# Patient Record
Sex: Female | Born: 1935
Health system: Southern US, Community
[De-identification: ages and names within clinical notes are randomized; demographics above are authoritative.]

## PROBLEM LIST (undated history)

## (undated) DIAGNOSIS — K269 Duodenal ulcer, unspecified as acute or chronic, without hemorrhage or perforation: Secondary | ICD-10-CM

## (undated) DIAGNOSIS — C44319 Basal cell carcinoma of skin of other parts of face: Secondary | ICD-10-CM

## (undated) DIAGNOSIS — K644 Residual hemorrhoidal skin tags: Secondary | ICD-10-CM

## (undated) DIAGNOSIS — R42 Dizziness and giddiness: Secondary | ICD-10-CM

## (undated) DIAGNOSIS — M3320 Polymyositis, organ involvement unspecified: Secondary | ICD-10-CM

## (undated) DIAGNOSIS — E785 Hyperlipidemia, unspecified: Secondary | ICD-10-CM

## (undated) DIAGNOSIS — B029 Zoster without complications: Secondary | ICD-10-CM

## (undated) DIAGNOSIS — S0590XA Unspecified injury of unspecified eye and orbit, initial encounter: Secondary | ICD-10-CM

## (undated) DIAGNOSIS — K648 Other hemorrhoids: Secondary | ICD-10-CM

## (undated) DIAGNOSIS — K219 Gastro-esophageal reflux disease without esophagitis: Secondary | ICD-10-CM

## (undated) DIAGNOSIS — M1990 Unspecified osteoarthritis, unspecified site: Secondary | ICD-10-CM

## (undated) HISTORY — DX: Other hemorrhoids: K64.8

## (undated) HISTORY — PX: TONSILLECTOMY: SUR1361

## (undated) HISTORY — DX: Hyperlipidemia, unspecified: E78.5

## (undated) HISTORY — DX: Basal cell carcinoma of skin of other parts of face: C44.319

## (undated) HISTORY — DX: Polymyositis, organ involvement unspecified: M33.20

## (undated) HISTORY — DX: Duodenal ulcer, unspecified as acute or chronic, without hemorrhage or perforation: K26.9

## (undated) HISTORY — DX: Zoster without complications: B02.9

## (undated) HISTORY — PX: COLONOSCOPY: SHX174

## (undated) HISTORY — DX: Gastro-esophageal reflux disease without esophagitis: K21.9

## (undated) HISTORY — DX: Residual hemorrhoidal skin tags: K64.4

## (undated) HISTORY — DX: Unspecified injury of unspecified eye and orbit, initial encounter: S05.90XA

## (undated) HISTORY — DX: Unspecified osteoarthritis, unspecified site: M19.90

## (undated) HISTORY — PX: ESOPHAGOGASTRODUODENOSCOPY: SHX1529

## (undated) HISTORY — DX: Dizziness and giddiness: R42

---

## 1998-07-22 ENCOUNTER — Other Ambulatory Visit: Admission: RE | Admit: 1998-07-22 | Discharge: 1998-07-22 | Payer: Self-pay | Admitting: Internal Medicine

## 1998-08-03 ENCOUNTER — Ambulatory Visit (HOSPITAL_COMMUNITY): Admission: RE | Admit: 1998-08-03 | Discharge: 1998-08-03 | Payer: Self-pay | Admitting: *Deleted

## 1998-11-09 ENCOUNTER — Encounter: Payer: Self-pay | Admitting: Rheumatology

## 1998-11-09 ENCOUNTER — Ambulatory Visit (HOSPITAL_COMMUNITY): Admission: RE | Admit: 1998-11-09 | Discharge: 1998-11-09 | Payer: Self-pay | Admitting: Rheumatology

## 1998-12-24 HISTORY — PX: CATARACT EXTRACTION, BILATERAL: SHX1313

## 2002-06-23 ENCOUNTER — Encounter: Payer: Self-pay | Admitting: Family Medicine

## 2002-06-23 LAB — CONVERTED CEMR LAB

## 2003-12-25 DIAGNOSIS — K269 Duodenal ulcer, unspecified as acute or chronic, without hemorrhage or perforation: Secondary | ICD-10-CM

## 2003-12-25 DIAGNOSIS — B029 Zoster without complications: Secondary | ICD-10-CM

## 2003-12-25 HISTORY — DX: Duodenal ulcer, unspecified as acute or chronic, without hemorrhage or perforation: K26.9

## 2003-12-25 HISTORY — DX: Zoster without complications: B02.9

## 2005-11-26 ENCOUNTER — Ambulatory Visit: Payer: Self-pay | Admitting: Family Medicine

## 2006-12-27 ENCOUNTER — Encounter: Payer: Self-pay | Admitting: Family Medicine

## 2006-12-30 ENCOUNTER — Ambulatory Visit: Payer: Self-pay | Admitting: Family Medicine

## 2006-12-30 DIAGNOSIS — D485 Neoplasm of uncertain behavior of skin: Secondary | ICD-10-CM

## 2006-12-30 DIAGNOSIS — E7800 Pure hypercholesterolemia, unspecified: Secondary | ICD-10-CM

## 2006-12-30 DIAGNOSIS — R1013 Epigastric pain: Secondary | ICD-10-CM | POA: Insufficient documentation

## 2006-12-31 ENCOUNTER — Encounter: Payer: Self-pay | Admitting: Family Medicine

## 2006-12-31 ENCOUNTER — Telehealth (INDEPENDENT_AMBULATORY_CARE_PROVIDER_SITE_OTHER): Payer: Self-pay | Admitting: *Deleted

## 2007-01-01 ENCOUNTER — Encounter: Payer: Self-pay | Admitting: Family Medicine

## 2007-01-01 LAB — CONVERTED CEMR LAB
ALT: 14 units/L (ref 0–35)
Albumin: 4.5 g/dL (ref 3.5–5.2)
CO2: 28 meq/L (ref 19–32)
Calcium: 9.2 mg/dL (ref 8.4–10.5)
Chloride: 106 meq/L (ref 96–112)
Cholesterol: 263 mg/dL — ABNORMAL HIGH (ref 0–200)
Creatinine, Ser: 0.71 mg/dL (ref 0.40–1.20)
Potassium: 4.4 meq/L (ref 3.5–5.3)
Total Protein: 6.8 g/dL (ref 6.0–8.3)

## 2007-01-07 ENCOUNTER — Ambulatory Visit: Payer: Self-pay | Admitting: Family Medicine

## 2007-01-07 DIAGNOSIS — L570 Actinic keratosis: Secondary | ICD-10-CM | POA: Insufficient documentation

## 2007-01-10 ENCOUNTER — Ambulatory Visit: Payer: Self-pay | Admitting: Family Medicine

## 2007-01-17 ENCOUNTER — Encounter: Payer: Self-pay | Admitting: Family Medicine

## 2007-01-17 ENCOUNTER — Telehealth (INDEPENDENT_AMBULATORY_CARE_PROVIDER_SITE_OTHER): Payer: Self-pay | Admitting: *Deleted

## 2007-01-21 ENCOUNTER — Encounter: Payer: Self-pay | Admitting: Family Medicine

## 2007-01-21 ENCOUNTER — Telehealth (INDEPENDENT_AMBULATORY_CARE_PROVIDER_SITE_OTHER): Payer: Self-pay | Admitting: *Deleted

## 2007-01-21 ENCOUNTER — Ambulatory Visit: Payer: Self-pay | Admitting: Internal Medicine

## 2007-01-23 ENCOUNTER — Encounter: Payer: Self-pay | Admitting: Internal Medicine

## 2007-01-31 ENCOUNTER — Ambulatory Visit: Payer: Self-pay | Admitting: Internal Medicine

## 2007-03-10 ENCOUNTER — Encounter: Payer: Self-pay | Admitting: Family Medicine

## 2007-03-11 ENCOUNTER — Encounter: Payer: Self-pay | Admitting: Family Medicine

## 2007-03-11 LAB — CONVERTED CEMR LAB
ALT: 22 units/L (ref 0–35)
Albumin: 4.7 g/dL (ref 3.5–5.2)
Cholesterol: 178 mg/dL (ref 0–200)
HDL: 48 mg/dL (ref >39)
LDL Cholesterol: 79 mg/dL (ref 0–99)
Total CHOL/HDL Ratio: 3.7
Total Protein: 7.2 g/dL (ref 6.0–8.3)
Triglycerides: 255 mg/dL — ABNORMAL HIGH (ref <150)
VLDL: 51 mg/dL — ABNORMAL HIGH (ref 0–40)

## 2007-03-12 ENCOUNTER — Encounter: Payer: Self-pay | Admitting: Family Medicine

## 2007-03-17 ENCOUNTER — Telehealth: Payer: Self-pay | Admitting: Family Medicine

## 2007-03-17 ENCOUNTER — Encounter: Payer: Self-pay | Admitting: Family Medicine

## 2007-08-18 ENCOUNTER — Telehealth: Payer: Self-pay | Admitting: Family Medicine

## 2007-08-20 ENCOUNTER — Encounter: Payer: Self-pay | Admitting: Family Medicine

## 2008-01-30 ENCOUNTER — Encounter: Admission: RE | Admit: 2008-01-30 | Discharge: 2008-01-30 | Payer: Self-pay | Admitting: Family Medicine

## 2008-01-30 ENCOUNTER — Ambulatory Visit: Payer: Self-pay | Admitting: Family Medicine

## 2008-01-30 DIAGNOSIS — R0789 Other chest pain: Secondary | ICD-10-CM

## 2008-03-30 ENCOUNTER — Telehealth: Payer: Self-pay | Admitting: Family Medicine

## 2008-03-30 ENCOUNTER — Encounter: Payer: Self-pay | Admitting: Family Medicine

## 2008-03-31 LAB — CONVERTED CEMR LAB
AST: 16 units/L (ref 0–37)
Albumin: 5.2 g/dL (ref 3.5–5.2)
Alkaline Phosphatase: 75 units/L (ref 39–117)
HDL: 55 mg/dL (ref >39)
LDL Cholesterol: 85 mg/dL (ref 0–99)
Potassium: 4.6 meq/L (ref 3.5–5.3)
Sodium: 142 meq/L (ref 135–145)
TSH: 2.114 microintl units/mL (ref 0.350–5.50)
Total Bilirubin: 0.5 mg/dL (ref 0.3–1.2)
Total Protein: 7.4 g/dL (ref 6.0–8.3)
Triglycerides: 108 mg/dL (ref <150)
VLDL: 22 mg/dL (ref 0–40)

## 2008-09-10 ENCOUNTER — Encounter: Payer: Self-pay | Admitting: Family Medicine

## 2008-09-12 LAB — CONVERTED CEMR LAB
ALT: 17 units/L (ref 0–35)
AST: 17 units/L (ref 0–37)
Alkaline Phosphatase: 69 units/L (ref 39–117)
BUN: 9 mg/dL (ref 6–23)
Calcium: 8.8 mg/dL (ref 8.4–10.5)
Chloride: 102 meq/L (ref 96–112)
Creatinine, Ser: 0.71 mg/dL (ref 0.40–1.20)
HDL: 48 mg/dL (ref >39)
LDL Cholesterol: 87 mg/dL (ref 0–99)
Potassium: 4 meq/L (ref 3.5–5.3)
Total CHOL/HDL Ratio: 3.2

## 2009-02-07 ENCOUNTER — Ambulatory Visit: Payer: Self-pay | Admitting: Family Medicine

## 2009-02-14 ENCOUNTER — Encounter: Payer: Self-pay | Admitting: Family Medicine

## 2009-02-15 ENCOUNTER — Encounter: Admission: RE | Admit: 2009-02-15 | Discharge: 2009-02-15 | Payer: Self-pay | Admitting: Family Medicine

## 2009-02-15 LAB — CONVERTED CEMR LAB
ALT: 17 units/L (ref 0–35)
AST: 18 units/L (ref 0–37)
Albumin: 4.8 g/dL (ref 3.5–5.2)
Alkaline Phosphatase: 76 units/L (ref 39–117)
Cholesterol, target level: 200 mg/dL
HDL goal, serum: 40 mg/dL
LDL Cholesterol: 112 mg/dL — ABNORMAL HIGH (ref 0–99)
LDL Goal: 160 mg/dL
Potassium: 4.1 meq/L (ref 3.5–5.3)
Sodium: 141 meq/L (ref 135–145)
TSH: 2.093 microintl units/mL (ref 0.350–4.50)
Total Bilirubin: 0.6 mg/dL (ref 0.3–1.2)
Total Protein: 7 g/dL (ref 6.0–8.3)
VLDL: 30 mg/dL (ref 0–40)

## 2010-03-15 ENCOUNTER — Ambulatory Visit: Payer: Self-pay | Admitting: Family Medicine

## 2010-03-16 LAB — CONVERTED CEMR LAB
ALT: 20 units/L (ref 0–35)
AST: 20 units/L (ref 0–37)
BUN: 11 mg/dL (ref 6–23)
Calcium: 9.3 mg/dL (ref 8.4–10.5)
Chloride: 104 meq/L (ref 96–112)
Creatinine, Ser: 0.78 mg/dL (ref 0.40–1.20)
HDL: 56 mg/dL (ref >39)
Total Bilirubin: 0.8 mg/dL (ref 0.3–1.2)
Total CHOL/HDL Ratio: 3.4
VLDL: 40 mg/dL (ref 0–40)

## 2010-03-20 ENCOUNTER — Encounter: Admission: RE | Admit: 2010-03-20 | Discharge: 2010-03-20 | Payer: Self-pay | Admitting: Family Medicine

## 2010-03-21 ENCOUNTER — Encounter: Payer: Self-pay | Admitting: Family Medicine

## 2010-03-22 ENCOUNTER — Encounter: Payer: Self-pay | Admitting: Family Medicine

## 2011-01-14 ENCOUNTER — Encounter: Payer: Self-pay | Admitting: Family Medicine

## 2011-01-23 NOTE — Letter (Signed)
Summary: Generic Letter  [facility]  [address]   [place], [place] [address]   Phone: [phone]  Fax: [phone]    03/22/2010  BABY GIEGER [address] [place], [place]  [address]  Dear Ms. Duell,  We have received your test results back and have been unable to reach you. Your mammogram came back normal.  Your bone density came back and your bones are still thin but no change or worsening which is great. Continue with the Calcium with vitamin D supplement.         Sincerely,   Nani Gasser, MD

## 2011-01-23 NOTE — Assessment & Plan Note (Signed)
Summary: CPE   Vital Signs:  Patient profile:   77 year old female Height:      62.5 inches Weight:      138 pounds BMI:     24.93 Pulse rate:   65 / minute BP sitting:   108 / 66  (left arm) Cuff size:   regular  Vitals Entered By: Kathlene November (March 15, 2010 8:58 AM) CC: CPE   Primary Care Provider:  Emannuel Vise,C  CC:  CPE.  History of Present Illness: Occ gets a heavy feeling in her chest with traveling.  But has had this most of her life. Feels it is stressed induced.  Once calms down CP is relieved. Her weight is up a little.  Had eye exam last year at [facility]. Does need new lenses.     Current Medications (verified): 1)  Oscal 500/200 D-3 500-200 Mg-Unit Tabs (Calcium-Vitamin D) .... Two Times A Day By Mouth 2)  Multivitamins  Tabs (Multiple Vitamin) .... Take 1 Tablet By Mouth Once A Day 3)  Omega-3 350 Mg  Caps (Omega-3 Fatty Acids) 4)  Simvastatin 40 Mg Tabs (Simvastatin) .... Take 1/2  Tablet By Mouth Once A Day At Bedtime  Allergies (verified): 1)  Iodine  Comments:  Nurse/Medical Assistant: The patient's medications and allergies were reviewed with the patient and were updated in the Medication and Allergy Lists. Kathlene November (March 15, 2010 8:58 AM)  Past History:  Past Surgical History: Last updated: 01/30/2008 None  Family History: Last updated: 03/15/2010 Father-alcoholism High chol-Mother, brothers, HTN-mother Mother died of stroke at 20  Social History: Last updated: 10/01/2006 [employer], Ph.D.  Mother lives in a retirement home here in [place].  Travels around the world.  Single and never had a sexual partner. Quit smoking 1962, no drugs, no EtOH, 1 caffeinated drink per day, Regular exercise, including walking and golf.  Past Medical History: Meds:Flax seed oil Shingles 2005  Family History: Father-alcoholism High chol-Mother, brothers, HTN-mother Mother died of stroke at 16  Review of Systems  The patient denies  anorexia, fever, weight loss, weight gain, vision loss, decreased hearing, hoarseness, chest pain, syncope, dyspnea on exertion, peripheral edema, prolonged cough, headaches, hemoptysis, abdominal pain, melena, hematochezia, severe indigestion/heartburn, hematuria, incontinence, genital sores, muscle weakness, suspicious skin lesions, transient blindness, difficulty walking, depression, unusual weight change, abnormal bleeding, enlarged lymph nodes, and breast masses.    Physical Exam  General:  Well-developed,well-nourished,in no acute distress; alert,appropriate and cooperative throughout examination Head:  Normocephalic and atraumatic without obvious abnormalities. No apparent alopecia or balding. Eyes:  No corneal or conjunctival inflammation noted. EOMI. Perrla.  Ears:  External ear exam shows no significant lesions or deformities.  Otoscopic examination reveals clear canals, tympanic membranes are intact bilaterally without bulging, retraction, inflammation or discharge. Hearing is grossly normal bilaterally. Nose:  External nasal examination shows no deformity or inflammation. Nasal mucosa are pink and moist without lesions or exudates. Mouth:  Oral mucosa and oropharynx without lesions or exudates.  Teeth in good repair. Neck:  No deformities, masses, or tenderness noted. Chest Wall:  No deformities, masses, or tenderness noted. Breasts:  No mass, nodules, thickening, tenderness, bulging, retraction, inflamation, nipple discharge or skin changes noted.   Lungs:  Normal respiratory effort, chest expands symmetrically. Lungs are clear to auscultation, no crackles or wheezes. Heart:  Normal rate and regular rhythm. S1 and S2 normal without gallop, murmur, click, rub or other extra sounds. Abdomen:  Bowel sounds positive,abdomen soft and non-tender without masses, organomegaly  or hernias noted. Msk:  No deformity or scoliosis noted of thoracic or lumbar spine.   Pulses:  R and L  carotid,radial,dorsalis pedis and posterior tibial pulses are full and equal bilaterally Extremities:  No clubbing, cyanosis, edema, or deformity noted with normal full range of motion of all joints.   Neurologic:  No cranial nerve deficits noted. Station and gait are normal.DTRs are symmetrical throughout. Sensory, motor and coordinative functions appear intact. Skin:  lost os seborrheaic keratosis on her trunk.  Cervical Nodes:  No lymphadenopathy noted Axillary Nodes:  No palpable lymphadenopathy Psych:  Cognition and judgment appear intact. Alert and cooperative with normal attention span and concentration. No apparent delusions, illusions, hallucinations   Impression & Recommendations:  Problem # 1:  PREVENTIVE HEALTH CARE (ICD-V70.0) Doign well overall. Did encourage her to get back into her exercise and work on weight loss Due for mammo and DEXA nda screening labs.   Vaccines are up to date.  Had shingles in 2005 Orders: T-Comprehensive Metabolic Panel [phone]) T-Lipid Profile [phone])  Complete Medication List: 1)  Oscal 500/200 D-3 500-200 Mg-unit Tabs (Calcium-vitamin d) .... Two times a day by mouth 2)  Multivitamins Tabs (Multiple vitamin) .... Take 1 tablet by mouth once a day 3)  Omega-3 350 Mg Caps (Omega-3 fatty acids) 4)  Simvastatin 40 Mg Tabs (Simvastatin) .... Take 1/2  tablet by mouth once a day at bedtime  Other Orders: T-Dual DXA Bone Density/ Axial ([id number]) T-Mammography Bilateral Screening ([address])  Prevention & Chronic Care Immunizations   Influenza vaccine: given  (10/24/2009)   Influenza vaccine due: 10/24/2010    Tetanus booster: 12/30/2006: Tdap   Tetanus booster due: 12/30/2016    Pneumococcal vaccine: Pneumovax  (12/30/2006)   Pneumococcal vaccine due: None    H. zoster vaccine: Not documented  Colorectal Screening   Hemoccult: Not documented   Hemoccult due: Not Indicated    Colonoscopy: Results: Normal. H.pylori stool was neg.    Results: Hemorrhoids-Internal and external    Location:  [facility] .    (01/31/2007)   Colonoscopy action/deferral: Repeat colonoscopy in 10 years.    (01/31/2007)   Colonoscopy due: 01/2017  Other Screening   Pap smear: Done  (06/23/2002)   Pap smear due: Not Indicated    Mammogram: Normal  (02/16/2009)   Mammogram due: 02/2011    DXA bone density scan: normal  (01/30/2008)   DXA scan due: 01/29/2010    Smoking status: quit  (12/27/2006)  Lipids   Total Cholesterol: 199  (02/14/2009)   LDL: 112  (02/14/2009)   LDL Direct: Not documented   HDL: 57  (02/14/2009)   Triglycerides: 149  (02/14/2009)    SGOT (AST): 18  (02/14/2009)   SGPT (ALT): 17  (02/14/2009) CMP ordered    Alkaline phosphatase: 76  (02/14/2009)   Total bilirubin: 0.6  (02/14/2009)  Self-Management Support :    Lipid self-management support: Not documented    Last Flu Vaccine:  Fluvax 3+ (12/30/2006 2:22:56 PM) Flu Vaccine Result Date:  10/24/2009 Flu Vaccine Result:  given Flu Vaccine Next Due:  1 yr Herpes Zoster Next Due:  Not Indicated

## 2011-01-23 NOTE — Letter (Signed)
Summary: Generic Letter  [facility] Medicine [facility]  [address]   [place], [place] [address]   Phone: [phone]  Fax: [phone]    03/21/2010  ANELIESE BEAUDRY [address] [place], [place]  [address]  Dear Ms. Bohanon,   We have received your lab results and have been unable to reach you by phone. Your labs look great except your triglyceride are high- they are much higher than they were the last time we checked them. You need to increase your Fish Oil tablets to 4 a day and restart exercise and we will recheck in 2 months.        Sincerely,   Nani Gasser, MD

## 2011-03-19 ENCOUNTER — Other Ambulatory Visit: Payer: Self-pay | Admitting: Family Medicine

## 2011-03-19 DIAGNOSIS — Z139 Encounter for screening, unspecified: Secondary | ICD-10-CM

## 2011-03-27 ENCOUNTER — Encounter: Payer: Self-pay | Admitting: Family Medicine

## 2011-04-02 ENCOUNTER — Encounter: Payer: Self-pay | Admitting: Family Medicine

## 2011-04-02 ENCOUNTER — Ambulatory Visit (INDEPENDENT_AMBULATORY_CARE_PROVIDER_SITE_OTHER): Payer: Managed Care, Other (non HMO) | Admitting: Family Medicine

## 2011-04-02 VITALS — BP 104/62 | HR 86 | Ht 62.25 in | Wt 135.0 lb

## 2011-04-02 DIAGNOSIS — E785 Hyperlipidemia, unspecified: Secondary | ICD-10-CM

## 2011-04-02 DIAGNOSIS — Z0000 Encounter for general adult medical examination without abnormal findings: Secondary | ICD-10-CM

## 2011-04-02 DIAGNOSIS — L570 Actinic keratosis: Secondary | ICD-10-CM

## 2011-04-02 NOTE — Progress Notes (Signed)
Subjective:    Patient ID: Carmen Casey, female    DOB: [birth year], 77 y.o.   MRN: [id number]  HPI Here for CPE. She still travels and teaches.  She has her mammogram scheduled for tomorrow. No specific omplaints. Does have 2 skin lesions she would like me to look at today.     Review of Systems  BP 104/62  Pulse 86  Ht 5' 2.25" (1.581 m)  Wt 135 lb (61.236 kg)  BMI 24.49 kg/m2    Allergies  Allergen Reactions  . Iodine     REACTION: itching    Past Medical History  Diagnosis Date  . Shingles 2005  . Basal cell carcinoma of forehead     History reviewed. No pertinent past surgical history.  History   Social History  . Marital Status: Single    Spouse Name: N/A    Number of Children: N/A  . Years of Education: N/A   Occupational History  . Not on file.   Social History Main Topics  . Smoking status: Former Smoker    Quit date: 12/24/1960  . Smokeless tobacco: Not on file  . Alcohol Use: No  . Drug Use: No  . Sexually Active: No   Other Topics Concern  . Not on file   Social History Narrative   [employer], Ph.D.  Mother lives in a retirement home here in [place].  Travels around the world.  Single and never had a sexual partner. Quit smoking 1962, no drugs, no EtOH, 1 caffeinated drink per day, Regular exercise, including walking and golf.    Family History  Problem Relation Age of Onset  . Hyperlipidemia Mother   . Hypertension Mother   . Stroke Mother 39  . Alcohol abuse Father   . Hyperlipidemia Sister     Current outpatient prescriptions:calcium-vitamin D (OSCAL WITH D) 500-200 MG-UNIT per tablet, Take 1 tablet by mouth daily. , Disp: , Rfl: ;  multivitamin (THERAGRAN) per tablet, Take 1 tablet by mouth daily.  , Disp: , Rfl: ;  Omega-3 350 MG CAPS, Take by mouth. 1 tabs po daily, Disp: , Rfl: ;  simvastatin (ZOCOR) 40 MG tablet, Take by mouth. Take 1/2 tab po qhs , Disp: , Rfl:      Objective:   Physical Exam  Constitutional: She  is oriented to person, place, and time. She appears well-developed and well-nourished.  HENT:  Head: Normocephalic and atraumatic.  Right Ear: External ear normal.  Left Ear: External ear normal.  Nose: Nose normal.  Mouth/Throat: Oropharynx is clear and moist.  Eyes: Conjunctivae and EOM are normal. Pupils are equal, round, and reactive to light.  Neck: Normal range of motion. Neck supple. No thyromegaly present.  Cardiovascular: Normal rate, regular rhythm, normal heart sounds and intact distal pulses.   Pulmonary/Chest: Effort normal and breath sounds normal.       Normal breast exam.   Abdominal: Soft. Bowel sounds are normal.  Musculoskeletal: Normal range of motion.  Lymphadenopathy:    She has no cervical adenopathy.  Neurological: She is alert and oriented to person, place, and time. She has normal reflexes.  Skin: Skin is warm and dry.       Actinic keratosis on the left ear pinna. Has a suspicious lesion for basal cell on her forehead. I did recommend she see her Derm for bx.   Psychiatric: She has a normal mood and affect.      Deviation of first and second fingers on her  left hand.  No pain.     Assessment & Plan:  CPE- doing well. She is very active and really takes care of herself. Notes she dose half her cholesterol pill. SHe had her labs drawn this AM.Colonscopy and vaccines are up to date. Due for bone diensity this year. Continue calcium supplement.  No need for pap.   Hyperlipidemia - Due to check LFTs and lipid levels. Has been one year.   Has a suspicious lesion for basal cell on her forehead. I did recommend she see her Derm for bx.   AK on her left ear. Cryotherapy performed and pt tolerated well.

## 2011-04-02 NOTE — Patient Instructions (Signed)
Keep up the regular exercise!  Great job!  Continue your calcium supplement.  We will call you with your lab results by the end of the week.   We will call you with your mammogram results as well.

## 2011-04-03 ENCOUNTER — Telehealth: Payer: Self-pay | Admitting: Family Medicine

## 2011-04-03 ENCOUNTER — Ambulatory Visit
Admission: RE | Admit: 2011-04-03 | Discharge: 2011-04-03 | Disposition: A | Discharge status: Home / Self Care | Payer: Medicare Other | Source: Ambulatory Visit | Attending: Family Medicine | Admitting: Family Medicine

## 2011-04-03 DIAGNOSIS — Z139 Encounter for screening, unspecified: Secondary | ICD-10-CM

## 2011-04-03 LAB — COMPLETE METABOLIC PANEL WITH GFR
ALT: 15 U/L (ref 0–35)
AST: 18 U/L (ref 0–37)
Albumin: 4.7 g/dL (ref 3.5–5.2)
Calcium: 9.5 mg/dL (ref 8.4–10.5)
Chloride: 105 mEq/L (ref 96–112)
Potassium: 4.3 mEq/L (ref 3.5–5.3)
Total Protein: 6.8 g/dL (ref 6.0–8.3)

## 2011-04-03 LAB — CBC
MCH: 31 pg (ref 26.0–34.0)
MCV: 94 fL (ref 78.0–100.0)
Platelets: 186 10*3/uL (ref 150–400)
RDW: 13.5 % (ref 11.5–15.5)

## 2011-04-03 LAB — LIPID PANEL
LDL Cholesterol: 86 mg/dL (ref 0–99)
VLDL: 29 mg/dL (ref 0–40)

## 2011-04-03 NOTE — Telephone Encounter (Signed)
Left message on vm with results  

## 2011-04-03 NOTE — Telephone Encounter (Signed)
Call pt: TG adn LDL look much better this year!!!!  Rest of labs look great as well.

## 2011-04-04 ENCOUNTER — Other Ambulatory Visit: Payer: Self-pay | Admitting: Family Medicine

## 2011-04-04 DIAGNOSIS — R928 Other abnormal and inconclusive findings on diagnostic imaging of breast: Secondary | ICD-10-CM

## 2011-04-05 ENCOUNTER — Telehealth: Payer: Self-pay | Admitting: Family Medicine

## 2011-04-05 NOTE — Telephone Encounter (Signed)
Left message to call back  

## 2011-04-05 NOTE — Telephone Encounter (Signed)
Call pt: there was an abnormality on her mammogram. They should contact you about setting up a diagnostic compression exam.

## 2011-04-10 ENCOUNTER — Ambulatory Visit
Admission: RE | Admit: 2011-04-10 | Discharge: 2011-04-10 | Disposition: A | Discharge status: Home / Self Care | Payer: Medicare Other | Source: Ambulatory Visit | Attending: Family Medicine | Admitting: Family Medicine

## 2011-04-10 DIAGNOSIS — R928 Other abnormal and inconclusive findings on diagnostic imaging of breast: Secondary | ICD-10-CM

## 2011-04-10 NOTE — Telephone Encounter (Signed)
Left message on vm with results  

## 2011-05-11 NOTE — Assessment & Plan Note (Signed)
[facility]                         GASTROENTEROLOGY OFFICE NOTE   Carmen Casey, Carmen Casey                        MRN:          [id number]  DATE:01/21/2007                            DOB:          10-02-1933    REFERRING PHYSICIAN:  Nani Gasser, M.D.   REASON FOR CONSULTATION:  Abdominal pain, reflux.   ASSESSMENT:  A 77 year old white woman with a history of a duodenal  ulcer in January 2005.  She has chronic mid chest and lower sternal or  epigastric discomfort that is worse with tight bras or twisting and  pressure.  Does not seem to be associated with eating, it is not  burning, it was not helped by 8 days of Protonix recently.  It is not  like her ulcer pain.  Her distal esophagus looked normal at her previous  endoscopy.  I do not think she has Barrett's esophagus.  She has had  numerous cardiac workups and this pain has been present for decades.  These cardiac workups have been negative.   She did have some transient early satiety in December, that is gone.  There is no unintentional weight loss.   RECOMMENDATIONS AND PLAN:  1. I think she probably has costochondritis symptoms and she should      try Tylenol or anti-inflammatories as needed.  2. Should the early satiety return, then upper GI endoscopy would be      reasonable, but right now it is gone and it was only temporary.  3. Screening colonoscopy is recommended and she agrees to proceed.      Risks, benefits and indications are explained.  4. I am going to check a stool for Helicobacter pylori antigen to see      if she has irradiated her H. pylori.  She did go to a clinic after      her duodenal ulcer was diagnosed and then was treated with      antibiotics, though I do not have those records.  (She travels      overseas and had her endoscopy in [place] and went to a [place]      health clinic in [place] after the duodenal ulcer was diagnosed).   HISTORY:  A 77 year old single  white woman who has had years of  intermittent discomfort in her mid chest and lower sternal area as well  as epigastrium.  It is an ache that occurs at times.  It is worse with  twisting sometimes and she often wears sports bras because regular bras  cause discomfort.  She denies any dysphagia.  She had some transient  early satiety in December when she was seeing  Dr. Linford Arnold.  She tried Protonix for about 8 days but did not feel much  better.  She has had numerous cardiac workups in the past, she tells me  they have all been negative.  When she gets busy or moves fast, she does  say that the ache does occur.  I do not think it effects her sleep.  Her  GI review of systems is otherwise negative.   PAST MEDICAL  HISTORY:  1. Duodenal ulcer diagnosed at endoscopy January 20, 2004, in [place].      I have reviewed this report.  It was a small duodenal ulcer.      Subsequent to that it sounds like she got antibiotics or perhaps H.      pylori.  She had an ultrasound of the abdomen at that time that      showed uterine leiomyomas but otherwise normal.  2. Dyslipidemia.  3. Osteoarthritis.  4. History of polymyositis treated with prednisone with some residual      pain problems.  5. Prior tonsillectomy.   MEDICATIONS:  1. Simvastatin 40 mg at bedtime.  2. Multivitamin daily.  3. Omega-3 fish oil.  4. Calcium supplements.   DRUG ALLERGIES:  IODINE   FAMILY HISTORY:  Mother died at age 58.  She had a history of heart  disease.   SOCIAL HISTORY:  She is single.  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  She has traveled the world for 30 years.  She has a  glass a wine a day or so, no tobacco or drug use.   REVIEW OF SYSTEMS:  Some joint pain, hot flashes at times.  All other  systems are negative.   PHYSICAL EXAMINATION:  Reveals a well-developed, well-nourished elderly  white woman in no acute distress.  Her height is 5 feet 2 inches.  Her  weight is 134 pounds.   Blood pressure 110/60.  EYES:  Anicteric.  ENT:  Normal mouth sand posterior pharynx.  NECK:  Supple without thyromegaly or mass.  CHEST:  Clear.  HEART:  S1, S2 no murmurs, rubs or gallops.  ABDOMEN:  Is soft, nontender without organomegaly or mass.  LYMPHATIC:  No neck or supraclavicular nodes.  CHEST WALL: Nontender.  PSYCH:  She is alert and oriented x3.  EXTREMITIES:  No edema.   RECENT LAB DATA:  Cholesterol 263, HDL 51, LDL 171, triglycerides 205.  CMET entirely normal.  I reviewed office notes sent by Dr. Linford Arnold.   Note she also has some actinic keratoses problems.   I appreciate the opportunity to care for this patient.     Iva Boop, MD,FACG  Electronically Signed    CEG/MedQ  DD: 01/21/2007  DT: 01/21/2007  Job #: [id number]   cc:   Nani Gasser, M.D.

## 2011-05-13 ENCOUNTER — Other Ambulatory Visit: Payer: Self-pay | Admitting: Family Medicine

## 2011-07-13 ENCOUNTER — Other Ambulatory Visit: Payer: Self-pay | Admitting: Family Medicine

## 2011-10-03 ENCOUNTER — Other Ambulatory Visit: Payer: Self-pay | Admitting: Family Medicine

## 2011-12-04 ENCOUNTER — Other Ambulatory Visit: Payer: Self-pay | Admitting: Family Medicine

## 2011-12-31 ENCOUNTER — Other Ambulatory Visit: Payer: Self-pay | Admitting: Family Medicine

## 2012-02-27 ENCOUNTER — Other Ambulatory Visit: Payer: Self-pay | Admitting: Family Medicine

## 2012-02-27 NOTE — Telephone Encounter (Signed)
Must make appointment 

## 2012-03-13 ENCOUNTER — Encounter: Payer: Self-pay | Admitting: *Deleted

## 2012-03-18 ENCOUNTER — Encounter: Payer: Self-pay | Admitting: Family Medicine

## 2012-03-18 ENCOUNTER — Ambulatory Visit (INDEPENDENT_AMBULATORY_CARE_PROVIDER_SITE_OTHER): Payer: Managed Care, Other (non HMO) | Admitting: Family Medicine

## 2012-03-18 VITALS — BP 105/61 | HR 94 | Ht 62.0 in | Wt 134.0 lb

## 2012-03-18 DIAGNOSIS — R140 Abdominal distension (gaseous): Secondary | ICD-10-CM

## 2012-03-18 DIAGNOSIS — Z9181 History of falling: Secondary | ICD-10-CM

## 2012-03-18 DIAGNOSIS — R141 Gas pain: Secondary | ICD-10-CM | POA: Diagnosis not present

## 2012-03-18 DIAGNOSIS — Z0000 Encounter for general adult medical examination without abnormal findings: Secondary | ICD-10-CM

## 2012-03-18 DIAGNOSIS — Z1331 Encounter for screening for depression: Secondary | ICD-10-CM

## 2012-03-18 DIAGNOSIS — I739 Peripheral vascular disease, unspecified: Secondary | ICD-10-CM

## 2012-03-18 LAB — LIPID PANEL
Total CHOL/HDL Ratio: 3 Ratio
VLDL: 20 mg/dL (ref 0–40)

## 2012-03-18 LAB — COMPLETE METABOLIC PANEL WITH GFR
Albumin: 4.4 g/dL (ref 3.5–5.2)
Alkaline Phosphatase: 69 U/L (ref 39–117)
Calcium: 8.9 mg/dL (ref 8.4–10.5)
Chloride: 100 mEq/L (ref 96–112)
GFR, Est Non African American: 87 mL/min
Glucose, Bld: 97 mg/dL (ref 70–99)
Potassium: 4.5 mEq/L (ref 3.5–5.3)
Sodium: 135 mEq/L (ref 135–145)
Total Protein: 6.5 g/dL (ref 6.0–8.3)

## 2012-03-18 LAB — CBC WITH DIFFERENTIAL/PLATELET
Basophils Absolute: 0 10*3/uL (ref 0.0–0.1)
Basophils Relative: 0 % (ref 0–1)
Hemoglobin: 13.1 g/dL (ref 12.0–15.0)
MCHC: 31.8 g/dL (ref 30.0–36.0)
Neutro Abs: 5.3 10*3/uL (ref 1.7–7.7)
Neutrophils Relative %: 63 % (ref 43–77)
RDW: 12.9 % (ref 11.5–15.5)
WBC: 8.4 10*3/uL (ref 4.0–10.5)

## 2012-03-18 LAB — LIPASE: Lipase: 21 U/L (ref 0–75)

## 2012-03-18 MED ORDER — AMBULATORY NON FORMULARY MEDICATION: Status: DC

## 2012-03-18 NOTE — Patient Instructions (Signed)
Start a regular exercise program and make sure you are eating a healthy diet Try to eat 4 servings of dairy a day or take a calcium supplement (500mg twice a day). Your vaccines are up to date.   

## 2012-03-18 NOTE — Progress Notes (Signed)
Subjective:    Carmen Casey is a 78 y.o. female who presents for Medicare Annual/Subsequent preventive examination.  When her fingers get cold she feels she looses circulation.  Once they turn white it takes awhile to get them warm.  Getting a little more arthritis in her hands as well.  No family hx of autoimmune d/o. No prior dx of reynauds.  Doesn't happen to the feet or other parts of hte body.   Felt bloated  On Friday night and felt like was coming down with viral illness.    Preventive Screening-Counseling & Management  Tobacco History  Smoking status  . Former Smoker  . Quit date: 12/24/1960  Smokeless tobacco  . Not on file     Problems Prior to Visit 1. Hands feeling cold  Current Problems (verified) Patient Active Problem List  Diagnoses  . NEOPLASM, SKIN, UNCERTAIN BEHAVIOR  . HYPERCHOLESTEROLEMIA, MILD  . KERATOSIS, ACTINIC    Medications Prior to Visit Current Outpatient Prescriptions on File Prior to Visit  Medication Sig Dispense Refill  . calcium-vitamin D (OSCAL WITH D) 500-200 MG-UNIT per tablet Take 1 tablet by mouth daily.       . multivitamin (THERAGRAN) per tablet Take 1 tablet by mouth daily.        . Omega-3 350 MG CAPS Take by mouth. 1 tabs po daily      . simvastatin (ZOCOR) 40 MG tablet TAKE 1/2 TABLET BY MOUTH ONCE A DAY AT BEDTIME  30 tablet  0    Current Medications (verified) Current Outpatient Prescriptions  Medication Sig Dispense Refill  . calcium-vitamin D (OSCAL WITH D) 500-200 MG-UNIT per tablet Take 1 tablet by mouth daily.       . multivitamin (THERAGRAN) per tablet Take 1 tablet by mouth daily.        . Omega-3 350 MG CAPS Take by mouth. 1 tabs po daily      . simvastatin (ZOCOR) 40 MG tablet TAKE 1/2 TABLET BY MOUTH ONCE A DAY AT BEDTIME  30 tablet  0  . AMBULATORY NON FORMULARY MEDICATION Medication Name: Zostavax IM x 1  1 vial  0     Allergies (verified) Iodine   PAST HISTORY  Family History Family History  Problem  Relation Age of Onset  . Hyperlipidemia Mother   . Hypertension Mother   . Stroke Mother 23  . Alcohol abuse Father   . Hyperlipidemia Sister     Social History History  Substance Use Topics  . Smoking status: Former Smoker    Quit date: 12/24/1960  . Smokeless tobacco: Not on file  . Alcohol Use: No     Are there smokers in your home (other than you)? No  Risk Factors Current exercise habits: water aerobics  Dietary issues discussed: None, she eats well.    Cardiac risk factors: advanced age (older than 38 for men, 11 for women) and sedentary lifestyle.  Depression Screen (Note: if answer to either of the following is "Yes", a more complete depression screening is indicated)   Over the past two weeks, have you felt down, depressed or hopeless? No  Over the past two weeks, have you felt little interest or pleasure in doing things? No  Have you lost interest or pleasure in daily life? No  Do you often feel hopeless? No  Do you cry easily over simple problems? No  Activities of Daily Living In your present state of health, do you have any difficulty performing the following activities?:  Driving? No Managing money?  No Feeding yourself? No Getting from bed to chair? No Climbing a flight of stairs? No Preparing food and eating?: No Bathing or showering? No Getting dressed: No Getting to the toilet? No Using the toilet:No Moving around from place to place: No In the past year have you fallen or had a near fall?:No   Are you sexually active?  No  Do you have more than one partner?  No  Hearing Difficulties: Yes Do you often ask people to speak up or repeat themselves? No Do you experience ringing or noises in your ears? No Do you have difficulty understanding soft or whispered voices? Yes   Do you feel that you have a problem with memory? No  Do you often misplace items? No  Do you feel safe at home?  Yes  Cognitive Testing  Alert? Yes  Normal  Appearance?Yes  Oriented to person? Yes  Place? Yes   Time? Yes  Recall of three objects?  Yes  Can perform simple calculations? Yes  Displays appropriate judgment?Yes  Can read the correct time from a watch face?Yes   Advanced Directives have been discussed with the patient? Yes  List the Names of Other Physician/Practitioners you currently use: 1.    Indicate any recent Medical Services you may have received from other than [facility] providers in the past year (date may be approximate).  Immunization History  Administered Date(s) Administered  . Influenza Whole 10/24/2005, 12/30/2006, 10/24/2009  . Pneumococcal Polysaccharide 12/30/2006  . Td 12/30/2006    Screening Tests Health Maintenance  Topic Date Due  . Zostavax  12/18/1995  . Influenza Vaccine  09/23/2012  . Tetanus/tdap  12/30/2016  . Colonoscopy  01/31/2017  . Pneumococcal Polysaccharide Vaccine Age 34 And Over  Completed    All answers were reviewed with the patient and necessary referrals were made:  Nandana Krolikowski, MD   03/18/2012   History reviewed: allergies, current medications, past family history, past medical history, past social history, past surgical history and problem list  Review of Systems A comprehensive review of systems was negative.    Objective:     Vision by Snellen chart: right ZOX:[id number] declines measurement, left eye:20/35  Body mass index is 24.51 kg/(m^2). BP 105/61  Pulse 94  Ht 5\' 2"  (1.575 m)  Wt 134 lb (60.782 kg)  BMI 24.51 kg/m2  BP 105/61  Pulse 94  Ht 5\' 2"  (1.575 m)  Wt 134 lb (60.782 kg)  BMI 24.51 kg/m2  General Appearance:    Alert, cooperative, no distress, appears stated age  Head:    Normocephalic, without obvious abnormality, atraumatic  Eyes:    PERRL, conjunctiva/corneas clear, EOM's intact, benign, both eyes  Ears:    Normal TM's and external ear canals, both ears  Nose:   Nares normal, septum midline, mucosa normal, no drainage    or sinus tenderness   Throat:   Lips, mucosa, and tongue normal; teeth and gums normal  Neck:   Supple, symmetrical, trachea midline, no adenopathy;    thyroid:  no enlargement/tenderness/nodules; no carotid   bruit or JVD  Back:     Symmetric, no curvature, ROM normal, no CVA tenderness  Lungs:     Clear to auscultation bilaterally, respirations unlabored  Chest Wall:    No tenderness or deformity   Heart:    Regular rate and rhythm, S1 and S2 normal, no murmur, rub   or gallop  Breast Exam:    No tenderness, masses, or nipple  abnormality  Abdomen:     Soft, non-tender, bowel sounds active all four quadrants,    no masses, no organomegaly  Genitalia:    Not performed.   Rectal:    Not performed.   Extremities:   Extremities normal, atraumatic, no cyanosis or edema  Pulses:   2+ and symmetric all extremities  Skin:   Skin color, texture, turgor normal, no rashes or lesions  Lymph nodes:   Cervical, supraclavicular, and axillary nodes normal  Neurologic:   CNII-XII intact, normal strength, sensation and reflexes    throughout       Assessment:     Annual Wellness Exam.       Plan:     During the course of the visit the patient was educated and counseled about appropriate screening and preventive services including:    shingles vaccine  Diet review for nutrition referral? Yes ____  Not Indicated __x__  Possible Reynaud's.  Will check CBC, sed rate, ANA.  Discussed keeping hand warm with gloves and running them under water.   Blaoting - Seems to have been transient but will check liver enzymes and lipase.    Patient Instructions (the written plan) was given to the patient.  Medicare Attestation I have personally reviewed: The patient's medical and social history Their use of alcohol, tobacco or illicit drugs Their current medications and supplements The patient's functional ability including ADLs,fall risks, home safety risks, cognitive, and hearing and visual impairment Diet and physical  activities Evidence for depression or mood disorders  The patient's weight, height, BMI, and visual acuity have been recorded in the chart.  I have made referrals, counseling, and provided education to the patient based on review of the above and I have provided the patient with a written personalized care plan for preventive services.     Bhavin Monjaraz, MD   03/18/2012

## 2012-03-19 LAB — ANA: Anti Nuclear Antibody(ANA): NEGATIVE

## 2012-03-19 LAB — SEDIMENTATION RATE: Sed Rate: 14 mm/hr (ref 0–22)

## 2012-03-25 ENCOUNTER — Encounter: Payer: Self-pay | Admitting: *Deleted

## 2012-04-10 ENCOUNTER — Other Ambulatory Visit: Payer: Self-pay | Admitting: Family Medicine

## 2012-05-01 ENCOUNTER — Other Ambulatory Visit: Payer: Self-pay | Admitting: *Deleted

## 2012-05-01 MED ORDER — AMBULATORY NON FORMULARY MEDICATION: Status: DC

## 2012-10-15 DIAGNOSIS — Z23 Encounter for immunization: Secondary | ICD-10-CM | POA: Diagnosis not present

## 2012-12-20 ENCOUNTER — Other Ambulatory Visit: Payer: Self-pay | Admitting: Family Medicine

## 2012-12-23 ENCOUNTER — Other Ambulatory Visit: Payer: Self-pay | Admitting: *Deleted

## 2013-03-09 ENCOUNTER — Other Ambulatory Visit: Payer: Self-pay | Admitting: *Deleted

## 2013-03-09 MED ORDER — SIMVASTATIN 40 MG PO TABS: ORAL_TABLET | ORAL | Status: DC

## 2013-03-19 ENCOUNTER — Telehealth: Payer: Self-pay | Admitting: *Deleted

## 2013-03-19 ENCOUNTER — Encounter: Payer: Self-pay | Admitting: Family Medicine

## 2013-03-19 ENCOUNTER — Ambulatory Visit (INDEPENDENT_AMBULATORY_CARE_PROVIDER_SITE_OTHER): Payer: Managed Care, Other (non HMO) | Admitting: Family Medicine

## 2013-03-19 VITALS — BP 107/58 | HR 71 | Ht 62.0 in | Wt 136.0 lb

## 2013-03-19 DIAGNOSIS — Z780 Asymptomatic menopausal state: Secondary | ICD-10-CM

## 2013-03-19 DIAGNOSIS — Z0000 Encounter for general adult medical examination without abnormal findings: Secondary | ICD-10-CM | POA: Diagnosis not present

## 2013-03-19 DIAGNOSIS — E785 Hyperlipidemia, unspecified: Secondary | ICD-10-CM | POA: Diagnosis not present

## 2013-03-19 DIAGNOSIS — Z1231 Encounter for screening mammogram for malignant neoplasm of breast: Secondary | ICD-10-CM

## 2013-03-19 NOTE — Patient Instructions (Addendum)
Keep up a regular exercise program and make sure you are eating a healthy diet Try to eat 4 servings of dairy a day, or if you are lactose intolerant take a calcium with vitamin D daily.  Your vaccines are up to date.   

## 2013-03-19 NOTE — Progress Notes (Signed)
Subjective:    Carmen Casey is a 79 y.o. female who presents for Medicare Annual/Subsequent preventive examination.  Preventive Screening-Counseling & Management  Tobacco History  Smoking status  . Former Smoker  . Quit date: 12/24/1960  Smokeless tobacco  . Not on file     Problems Prior to Visit 1. ST x 3 days with some phelgm. No fever or cough or other URI sxs.  Voice was raspy.  Vomited once about  3 days ago. No pain with swallowing. Hoarseness is actually a little bit better today. No GI symptoms. She took some type of over-the-counter supplement for sore throat a friend brought her. 2. Injured her foot when in [place] after a fall. Did have an xray done that was neg for fracture. Injury was 5 weeks ago.  ACE wrap has helped.  Overall her foot is much better but still a little sore when she walks on it for an extended period. No swelling. She has had some aching up into her left leg just lateral to the tibia. 3. Occ has trouble swallowing if eats to fast. Says usually feels it going down over the uper esophagus. She is ok with monitoring it. No hx of esophageal stricture.  She had an EGD years ago.   Current Problems (verified) Patient Active Problem List  Diagnosis  . NEOPLASM, SKIN, UNCERTAIN BEHAVIOR  . HYPERCHOLESTEROLEMIA, MILD  . KERATOSIS, ACTINIC    Medications Prior to Visit Current Outpatient Prescriptions on File Prior to Visit  Medication Sig Dispense Refill  . simvastatin (ZOCOR) 40 MG tablet TAKE 1/2 TABLET BY MOUTH EVERY NIGHT AT BEDTIME  30 tablet  3   No current facility-administered medications on file prior to visit.    Current Medications (verified) Current Outpatient Prescriptions  Medication Sig Dispense Refill  . simvastatin (ZOCOR) 40 MG tablet TAKE 1/2 TABLET BY MOUTH EVERY NIGHT AT BEDTIME  30 tablet  3   No current facility-administered medications for this visit.     Allergies (verified) Iodine   PAST HISTORY  Family  History Family History  Problem Relation Age of Onset  . Hyperlipidemia Mother   . Hypertension Mother   . Stroke Mother 26  . Alcohol abuse Father   . Hyperlipidemia Sister     Social History History  Substance Use Topics  . Smoking status: Former Smoker    Quit date: 12/24/1960  . Smokeless tobacco: Not on file  . Alcohol Use: No     Are there smokers in your home (other than you)? No  Risk Factors Current exercise habits: golf, water aerobics  Dietary issues discussed: non   Cardiac risk factors: advanced age (older than 36 for men, 70 for women) and dyslipidemia.  Depression Screen (Note: if answer to either of the following is "Yes", a more complete depression screening is indicated)   Over the past two weeks, have you felt down, depressed or hopeless? No  Over the past two weeks, have you felt little interest or pleasure in doing things? No  Have you lost interest or pleasure in daily life? No  Do you often feel hopeless? No  Do you cry easily over simple problems? No  Activities of Daily Living In your present state of health, do you have any difficulty performing the following activities?:  Driving? No Managing money?  No Feeding yourself? No Getting from bed to chair? No Climbing a flight of stairs? No Preparing food and eating?: No Bathing or showering? No Getting dressed: No Getting  to the toilet? No Using the toilet:No Moving around from place to place: No In the past year have you fallen or had a near fall?:No   Are you sexually active?  No  Do you have more than one partner?  No  Hearing Difficulties: Yes, has hearing aids since i last saw her.  Do you often ask people to speak up or repeat themselves? Yes Do you experience ringing or noises in your ears? No Do you have difficulty understanding soft or whispered voices? Yes   Do you feel that you have a problem with memory? No  Do you often misplace items? No  Do you feel safe at home?   No  Cognitive Testing  Alert? Yes  Normal Appearance?Yes  Oriented to person? Yes  Place? Yes   Time? Yes  Recall of three objects?  Yes  Can perform simple calculations? Yes  Displays appropriate judgment?Yes  Can read the correct time from a watch face?Yes   Advanced Directives have been discussed with the patient? Yes  List the Names of Other Physician/Practitioners you currently use: 1.  Dr. Lenn Cal  Indicate any recent Medical Services you may have received from other than [facility] providers in the past year (date may be approximate).  Immunization History  Administered Date(s) Administered  . Influenza Whole 10/24/2005, 12/30/2006, 10/24/2009  . Pneumococcal Polysaccharide 12/30/2006  . Td 12/30/2006    Screening Tests Health Maintenance  Topic Date Due  . Zostavax  12/18/1995  . Influenza Vaccine  08/24/2012  . Tetanus/tdap  12/30/2016  . Colonoscopy  01/31/2017  . Pneumococcal Polysaccharide Vaccine Age 71 And Over  Completed    All answers were reviewed with the patient and necessary referrals were made:  METHENEY,CATHERINE, MD   03/19/2013   History reviewed: allergies, current medications, past family history, past medical history, past social history, past surgical history and problem list  Review of Systems A comprehensive review of systems was negative.    Objective:     Vision by Snellen chart: Had eye exam in the last year in [facility] , Dr. Lenn Cal.   Body mass index is 24.87 kg/(m^2). BP 107/58  Pulse 71  Ht 5\' 2"  (1.575 m)  Wt 136 lb (61.689 kg)  BMI 24.87 kg/m2  BP 107/58  Pulse 71  Ht 5\' 2"  (1.575 m)  Wt 136 lb (61.689 kg)  BMI 24.87 kg/m2  General Appearance:    Alert, cooperative, no distress, appears stated age  Head:    Normocephalic, without obvious abnormality, atraumatic  Eyes:    PERRL, conjunctiva/corneas clear, EOM's intact,, both eyes  Ears:    Normal TM's and external ear canals, both ears  Nose:   Nares  normal, septum midline, mucosa normal, no drainage    or sinus tenderness  Throat:   Lips, mucosa, and tongue normal; teeth and gums normal, petechiae on posterior pharynx   Neck:   Supple, symmetrical, trachea midline, no adenopathy;    thyroid:  no enlargement/tenderness/nodules; no carotid   bruit or JVD  Back:     Symmetric, no curvature, ROM normal, no CVA tenderness  Lungs:     Clear to auscultation bilaterally, respirations unlabored  Chest Wall:    No tenderness or deformity   Heart:    Regular rate and rhythm, S1 and S2 normal, no murmur, rub   or gallop  Breast Exam:    No tenderness, masses, or nipple abnormality  Abdomen:     Soft, non-tender, bowel  sounds active all four quadrants,    no masses, no organomegaly  Genitalia:    Not performed.   Rectal:    Not perfromed.   Extremities:   Extremities normal, atraumatic, no cyanosis or edema  Pulses:   2+ and symmetric all extremities  Skin:   Skin color, texture, turgor normal, no rashes or lesions  Lymph nodes:   Cervical, supraclavicular, and axillary nodes normal  Neurologic:   CNII-XII intact, normal strength, sensation and reflexes    throughout       Assessment:     Annual Wellness Exam.       Plan:     During the course of the visit the patient was educated and counseled about appropriate screening and preventive services including:    Screening electrocardiogram  EKG - Rate of 67bpm, NSR, Inverted T wave in V2.  Normal axis.  No acute ST-T wave changes.    Left foot pain-encouraged her to get about 3 more weeks for total of 8 weeks to completely improve since she did not have a fracture on x-ray. Encouraged her to wear good supportive shoes and not to walk on her wood flooring if possible. If at that point still causing significant discomfort when she walks on it I encouraged her to call and we will get an x-ray.  Viral pharyngitis-recommend symptomatic care. Saline gargles. Can use warm tea with honey. Can  also try zinc lozenges. If not better in a week then please give me a call. She did have some small petechiae which is most consistent with a virus.  Due for mammogram and DEXA scan. Will place orders for both  Diet review for nutrition referral? Yes ____  Not Indicated _x_   Patient Instructions (the written plan) was given to the patient.  Medicare Attestation I have personally reviewed: The patient's medical and social history Their use of alcohol, tobacco or illicit drugs Their current medications and supplements The patient's functional ability including ADLs,fall risks, home safety risks, cognitive, and hearing and visual impairment Diet and physical activities Evidence for depression or mood disorders  The patient's weight, height, BMI, and visual acuity have been recorded in the chart.  I have made referrals, counseling, and provided education to the patient based on review of the above and I have provided the patient with a written personalized care plan for preventive services.     METHENEY,CATHERINE, MD   03/19/2013

## 2013-03-20 ENCOUNTER — Ambulatory Visit: Payer: Managed Care, Other (non HMO)

## 2013-03-20 ENCOUNTER — Encounter: Payer: Self-pay | Admitting: *Deleted

## 2013-03-24 ENCOUNTER — Telehealth: Payer: Self-pay | Admitting: Family Medicine

## 2013-03-24 ENCOUNTER — Ambulatory Visit (INDEPENDENT_AMBULATORY_CARE_PROVIDER_SITE_OTHER): Payer: Managed Care, Other (non HMO)

## 2013-03-24 ENCOUNTER — Ambulatory Visit (HOSPITAL_BASED_OUTPATIENT_CLINIC_OR_DEPARTMENT_OTHER)
Admission: RE | Admit: 2013-03-24 | Discharge: 2013-03-24 | Disposition: A | Discharge status: Home / Self Care | Payer: Managed Care, Other (non HMO) | Source: Ambulatory Visit | Attending: Family Medicine | Admitting: Family Medicine

## 2013-03-24 ENCOUNTER — Ambulatory Visit: Payer: Managed Care, Other (non HMO)

## 2013-03-24 DIAGNOSIS — M949 Disorder of cartilage, unspecified: Secondary | ICD-10-CM | POA: Diagnosis not present

## 2013-03-24 DIAGNOSIS — M899 Disorder of bone, unspecified: Secondary | ICD-10-CM

## 2013-03-24 DIAGNOSIS — Z1231 Encounter for screening mammogram for malignant neoplasm of breast: Secondary | ICD-10-CM | POA: Insufficient documentation

## 2013-03-24 DIAGNOSIS — Z780 Asymptomatic menopausal state: Secondary | ICD-10-CM

## 2013-03-24 DIAGNOSIS — Z13820 Encounter for screening for osteoporosis: Secondary | ICD-10-CM | POA: Diagnosis not present

## 2013-03-24 LAB — COMPLETE METABOLIC PANEL WITH GFR
ALT: 20 U/L (ref 0–35)
AST: 16 U/L (ref 0–37)
Alkaline Phosphatase: 75 U/L (ref 39–117)
GFR, Est Non African American: 85 mL/min
Sodium: 139 mEq/L (ref 135–145)
Total Bilirubin: 0.6 mg/dL (ref 0.3–1.2)
Total Protein: 6.8 g/dL (ref 6.0–8.3)

## 2013-03-24 LAB — LIPID PANEL
HDL: 50 mg/dL (ref >39)
LDL Cholesterol: 116 mg/dL — ABNORMAL HIGH (ref 0–99)
Total CHOL/HDL Ratio: 3.8 Ratio

## 2013-03-24 NOTE — Telephone Encounter (Signed)
Call pt: Bone density is stable No sign change from 2 years ago. Still in the osteopenia range.

## 2013-03-25 NOTE — Telephone Encounter (Signed)
Patient notified of results. Barry Dienes, LPN

## 2013-04-07 DIAGNOSIS — H26499 Other secondary cataract, unspecified eye: Secondary | ICD-10-CM | POA: Diagnosis not present

## 2013-04-07 DIAGNOSIS — H0230 Blepharochalasis unspecified eye, unspecified eyelid: Secondary | ICD-10-CM | POA: Diagnosis not present

## 2013-04-07 DIAGNOSIS — H02409 Unspecified ptosis of unspecified eyelid: Secondary | ICD-10-CM | POA: Diagnosis not present

## 2013-04-07 DIAGNOSIS — H179 Unspecified corneal scar and opacity: Secondary | ICD-10-CM | POA: Diagnosis not present

## 2013-09-14 DIAGNOSIS — Z23 Encounter for immunization: Secondary | ICD-10-CM | POA: Diagnosis not present

## 2013-10-13 DIAGNOSIS — H0230 Blepharochalasis unspecified eye, unspecified eyelid: Secondary | ICD-10-CM | POA: Diagnosis not present

## 2013-10-13 DIAGNOSIS — H26499 Other secondary cataract, unspecified eye: Secondary | ICD-10-CM | POA: Diagnosis not present

## 2013-10-13 DIAGNOSIS — H179 Unspecified corneal scar and opacity: Secondary | ICD-10-CM | POA: Diagnosis not present

## 2013-10-20 DIAGNOSIS — H26499 Other secondary cataract, unspecified eye: Secondary | ICD-10-CM | POA: Diagnosis not present

## 2013-11-10 ENCOUNTER — Other Ambulatory Visit: Payer: Self-pay | Admitting: Family Medicine

## 2013-11-24 DIAGNOSIS — H02409 Unspecified ptosis of unspecified eyelid: Secondary | ICD-10-CM | POA: Diagnosis not present

## 2013-11-24 DIAGNOSIS — H0230 Blepharochalasis unspecified eye, unspecified eyelid: Secondary | ICD-10-CM | POA: Diagnosis not present

## 2013-11-24 DIAGNOSIS — H179 Unspecified corneal scar and opacity: Secondary | ICD-10-CM | POA: Diagnosis not present

## 2013-11-24 DIAGNOSIS — H26499 Other secondary cataract, unspecified eye: Secondary | ICD-10-CM | POA: Diagnosis not present

## 2013-12-23 ENCOUNTER — Other Ambulatory Visit: Payer: Self-pay | Admitting: Family Medicine

## 2014-01-26 ENCOUNTER — Other Ambulatory Visit: Payer: Self-pay | Admitting: Family Medicine

## 2014-03-26 ENCOUNTER — Other Ambulatory Visit: Payer: Self-pay | Admitting: Family Medicine

## 2014-04-01 ENCOUNTER — Encounter: Payer: Self-pay | Admitting: Family Medicine

## 2014-04-01 ENCOUNTER — Ambulatory Visit (INDEPENDENT_AMBULATORY_CARE_PROVIDER_SITE_OTHER): Payer: Medicare Other

## 2014-04-01 ENCOUNTER — Ambulatory Visit (INDEPENDENT_AMBULATORY_CARE_PROVIDER_SITE_OTHER): Payer: Medicare Other | Admitting: Family Medicine

## 2014-04-01 VITALS — BP 120/62 | HR 63 | Ht 62.0 in | Wt 140.0 lb

## 2014-04-01 DIAGNOSIS — Z0000 Encounter for general adult medical examination without abnormal findings: Secondary | ICD-10-CM

## 2014-04-01 DIAGNOSIS — Z1231 Encounter for screening mammogram for malignant neoplasm of breast: Secondary | ICD-10-CM

## 2014-04-01 DIAGNOSIS — R0789 Other chest pain: Secondary | ICD-10-CM | POA: Diagnosis not present

## 2014-04-01 DIAGNOSIS — L570 Actinic keratosis: Secondary | ICD-10-CM

## 2014-04-01 DIAGNOSIS — E785 Hyperlipidemia, unspecified: Secondary | ICD-10-CM | POA: Diagnosis not present

## 2014-04-01 DIAGNOSIS — L821 Other seborrheic keratosis: Secondary | ICD-10-CM

## 2014-04-01 LAB — LIPID PANEL
CHOLESTEROL: 164 mg/dL (ref 0–200)
HDL: 55 mg/dL (ref >39)
LDL CALC: 83 mg/dL (ref 0–99)
TRIGLYCERIDES: 132 mg/dL (ref <150)
Total CHOL/HDL Ratio: 3 Ratio
VLDL: 26 mg/dL (ref 0–40)

## 2014-04-01 LAB — COMPLETE METABOLIC PANEL WITH GFR
ALBUMIN: 4.5 g/dL (ref 3.5–5.2)
ALT: 21 U/L (ref 0–35)
AST: 22 U/L (ref 0–37)
Alkaline Phosphatase: 74 U/L (ref 39–117)
BUN: 8 mg/dL (ref 6–23)
CALCIUM: 9.6 mg/dL (ref 8.4–10.5)
CHLORIDE: 105 meq/L (ref 96–112)
CO2: 25 meq/L (ref 19–32)
CREATININE: 0.64 mg/dL (ref 0.50–1.10)
GFR, Est Non African American: 86 mL/min
Glucose, Bld: 80 mg/dL (ref 70–99)
POTASSIUM: 4.5 meq/L (ref 3.5–5.3)
Sodium: 142 mEq/L (ref 135–145)
Total Bilirubin: 0.7 mg/dL (ref 0.2–1.2)
Total Protein: 6.8 g/dL (ref 6.0–8.3)

## 2014-04-01 MED ORDER — SIMVASTATIN 40 MG PO TABS: ORAL_TABLET | ORAL | Status: DC

## 2014-04-01 NOTE — Progress Notes (Deleted)
  Subjective:     Carmen Casey is a 80 y.o. female and is here for a comprehensive physical exam. The patient reports {problems:16946}.  History   Social History  . Marital Status: Single    Spouse Name: N/A    Number of Children: N/A  . Years of Education: N/A   Occupational History  . missionary.     Social History Main Topics  . Smoking status: Former Smoker    Quit date: 12/24/1960  . Smokeless tobacco: Not on file  . Alcohol Use: No  . Drug Use: No  . Sexual Activity: No   Other Topics Concern  . Not on file   Social History Narrative   [employer], Ph.D.  Mother lives in a retirement home here in [place].  Travels around the world.  Single and never had a sexual partner. Quit smoking 1962, no drugs, no EtOH, 1 caffeinated drink per day, Regular exercise, including walking and golf.  Water exercising.           Health Maintenance  Topic Date Due  . Influenza Vaccine  07/24/2014  . Tetanus/tdap  12/30/2016  . Colonoscopy  01/31/2017  . Pneumococcal Polysaccharide Vaccine Age 3 And Over  Completed  . Zostavax  Completed    {Common ambulatory SmartLinks:19316}  Review of Systems {ros; complete:30496}   Objective:    {Exam, [phone]}    Assessment:    Healthy female exam. ***     Plan:     See After Visit Summary for Counseling Recommendations

## 2014-04-01 NOTE — Progress Notes (Addendum)
Subjective:    Carmen Casey is a 80 y.o. female who presents for Medicare Annual/Subsequent preventive examination.  Preventive Screening-Counseling & Management  Tobacco History  Smoking status  . Former Smoker  . Quit date: 12/24/1960  Smokeless tobacco  . Not on file     Problems Prior to Visit 1. Atypical chest pain when lifting boxes and gardening.  Not with walking or other activities.   2. Skin lesion on the eyebrow.   3. Leson on the right breast that thinks has changes. No pain or discharge.   Current Problems (verified) Patient Active Problem List   Diagnosis Date Noted  . KERATOSIS, ACTINIC 01/07/2007  . NEOPLASM, SKIN, UNCERTAIN BEHAVIOR 99/37/1696  . HYPERCHOLESTEROLEMIA, MILD 12/30/2006    Medications Prior to Visit Current Outpatient Prescriptions on File Prior to Visit  Medication Sig Dispense Refill  . simvastatin (ZOCOR) 40 MG tablet TAKE 1/2 TABLET BY MOUTH EVERY NIGHT AT BEDTIME  30 tablet  0   No current facility-administered medications on file prior to visit.    Current Medications (verified) Current Outpatient Prescriptions  Medication Sig Dispense Refill  . simvastatin (ZOCOR) 40 MG tablet TAKE 1/2 TABLET BY MOUTH EVERY NIGHT AT BEDTIME  30 tablet  0   No current facility-administered medications for this visit.     Allergies (verified) Iodine   PAST HISTORY  Family History Family History  Problem Relation Age of Onset  . Hyperlipidemia Mother   . Hypertension Mother   . Stroke Mother 68  . Alcohol abuse Father   . Hyperlipidemia Sister     Social History History  Substance Use Topics  . Smoking status: Former Smoker    Quit date: 12/24/1960  . Smokeless tobacco: Not on file  . Alcohol Use: No     Are there smokers in your home (other than you)? No  Risk Factors Current exercise habits: golf and water aerobic  Dietary issues discussed: none   Cardiac risk factors: dyslipidemia.  Depression Screen (Note: if  answer to either of the following is "Yes", a more complete depression screening is indicated)   Over the past two weeks, have you felt down, depressed or hopeless? No  Over the past two weeks, have you felt little interest or pleasure in doing things? No  Have you lost interest or pleasure in daily life? No  Do you often feel hopeless? No  Do you cry easily over simple problems? No  Activities of Daily Living In your present state of health, do you have any difficulty performing the following activities?:  Driving? No Managing money?  No Feeding yourself? No Getting from bed to chair? No Climbing a flight of stairs? No Preparing food and eating?: No Bathing or showering? No Getting dressed: No Getting to the toilet? No Using the toilet:No Moving around from place to place: No In the past year have you fallen or had a near fall?:No   Are you sexually active?  No  Do you have more than one partner?  No  Hearing Difficulties: Yes, got hearing aids about 2 years ago.    Do you feel that you have a problem with memory? No  Do you often misplace items? No  Do you feel safe at home?  Yes  Cognitive Testing  Alert? Yes  Normal Appearance?Yes  Oriented to person? Yes  Place? Yes   Time? Yes  Recall of three objects?  Yes  Can perform simple calculations? Yes  Displays appropriate judgment?Yes  Can read  the correct time from a watch face?Yes   Advanced Directives have been discussed with the patient? No  List the Names of Other Physician/Practitioners you currently use: 1.  Dr. Ileene Rubens - dentis 2. Dr. Vinnie Level - optometrist  Indicate any recent Medical Services you may have received from other than [facility] providers in the past year (date may be approximate).  Immunization History  Administered Date(s) Administered  . Influenza Split 10/24/2012  . Influenza Whole 10/24/2005, 12/30/2006, 10/24/2009  . Influenza-Unspecified 09/23/2013  . Pneumococcal Polysaccharide-23 12/30/2006   . Td 12/30/2006  . Zoster 10/24/2012    Screening Tests Health Maintenance  Topic Date Due  . Influenza Vaccine  07/24/2014  . Tetanus/tdap  12/30/2016  . Colonoscopy  01/31/2017  . Pneumococcal Polysaccharide Vaccine Age 56 And Over  Completed  . Zostavax  Completed    All answers were reviewed with the patient and necessary referrals were made:  Laurene Melendrez, MD   04/01/2014   History reviewed: allergies, current medications, past family history, past medical history, past social history, past surgical history and problem list  Review of Systems A comprehensive review of systems was negative.    Objective:     Vision by Snellen chart: had eye exam this year. Will call for report   Body mass index is 25.6 kg/(m^2). BP 120/62  Pulse 63  Ht 5\' 2"  (1.575 m)  Wt 140 lb (63.504 kg)  BMI 25.60 kg/m2  BP 120/62  Pulse 63  Ht 5\' 2"  (1.575 m)  Wt 140 lb (63.504 kg)  BMI 25.60 kg/m2  General Appearance:    Alert, cooperative, no distress, appears stated age  Head:    Normocephalic, without obvious abnormality, atraumatic  Eyes:    PERRL, conjunctiva/corneas clear, EOM's intact, fundi    benign, both eyes  Ears:    Normal TM's and external ear canals, both ears  Nose:   Nares normal, septum midline, mucosa normal, no drainage    or sinus tenderness  Throat:   Lips, mucosa, and tongue normal; teeth and gums normal  Neck:   Supple, symmetrical, trachea midline, no adenopathy;    thyroid:  no enlargement/tenderness/nodules; no carotid   bruit or JVD  Back:     Symmetric, no curvature, ROM normal, no CVA tenderness  Lungs:     Clear to auscultation bilaterally, respirations unlabored  Chest Wall:    No tenderness or deformity   Heart:    Regular rate and rhythm, S1 and S2 normal, no murmur, rub   or gallop  Breast Exam:    No tenderness, masses, or nipple abnormality  Abdomen:     Soft, non-tender, bowel sounds active all four quadrants,    no masses, no organomegaly   Genitalia:    Not performed.   Rectal:    Not performed  Extremities:   Extremities normal, atraumatic, no cyanosis or edema  Pulses:   2+ and symmetric all extremities  Skin:   Skin color, texture, turgor normal, no rashes. She does have an actinic keratosis over the left eyebrow ridge and one towards the middle of the forehead and between the eyebrows. She also has a large seborrheic keratosis on the right outer breast that is more darkly pigmented nevus center and more lightly pigmented on the edges. She thinks it may have been changing.   Lymph nodes:   Cervical, supraclavicular, and axillary nodes normal  Neurologic:   CNII-XII intact, normal strength, sensation and reflexes    throughout  Assessment:     Medicare Wellness Exam.      Plan:     During the course of the visit the patient was educated and counseled about appropriate screening and preventive services including:    Screening mammography - had done this AM  Hyperlipidemia-due for repeat lipid and metabolic panel  Actinic keratoses on her forehead. Discussed treatment options. We'll perform cryotherapy today.  Seborrheic her ptosis on the right breast-she feels like it may be changing. Cryotherapy performed. The lesion recurs then recommend shave biopsy for further evaluation.  Atypical chest pain-will get repeat EKG today. It sounds like it's more muscular from lifting things but we'll do an EKG just to make sure there's no changes from last year. EKG shows rate of 58 bpm, low voltage QRS. No acute changes.    Diet review for nutrition referral? Yes ____  Not Indicated _x__   Patient Instructions (the written plan) was given to the patient.  Medicare Attestation I have personally reviewed: The patient's medical and social history Their use of alcohol, tobacco or illicit drugs Their current medications and supplements The patient's functional ability including ADLs,fall risks, home safety risks,  cognitive, and hearing and visual impairment Diet and physical activities Evidence for depression or mood disorders  The patient's weight, height, BMI, and visual acuity have been recorded in the chart.  I have made referrals, counseling, and provided education to the patient based on review of the above and I have provided the patient with a written personalized care plan for preventive services.     Nadie Fiumara, MD   04/01/2014    Cryotherapy Procedure Note  Pre-operative Diagnosis: Actinic keratosis, seborrheic keratosis  Post-operative Diagnosis: forehead with 2 actinic keratosis and one seborrheic keratosis on the right breast   Locations: see above  Indications: none  Anesthesia: not required    Procedure Details  Patient informed of risks (permanent scarring, infection, light or dark discoloration, bleeding, infection, weakness, numbness and recurrence of the lesion) and benefits of the procedure and verbal informed consent obtained.  The areas are treated with liquid nitrogen therapy, frozen until ice ball extended 2 mm beyond lesion, allowed to thaw, and treated again. The patient tolerated procedure well.  The patient was instructed on post-op care, warned that there may be blister formation, redness and pain. Recommend OTC analgesia as needed for pain.  Condition: Stable  Complications: none.  Plan: 1. Instructed to keep the area dry and covered for 24-48h and clean thereafter. 2. Warning signs of infection were reviewed.   3. Recommended that the patient use OTC acetaminophen as needed for pain.  4. Return as needed.

## 2014-04-01 NOTE — Patient Instructions (Signed)
Keep up a regular exercise program and make sure you are eating a healthy diet Try to eat 4 servings of dairy a day, or if you are lactose intolerant take a calcium with vitamin D daily.  Your vaccines are up to date.   Clean wounds with regular soap and water. Do not use alcohol or peroxide. Can apply a small dab Vaseline to moisturize the area.

## 2014-04-02 ENCOUNTER — Other Ambulatory Visit: Payer: Self-pay | Admitting: Family Medicine

## 2014-04-02 NOTE — Progress Notes (Signed)
Quick Note:  All labs are normal. ______ 

## 2014-04-05 ENCOUNTER — Encounter: Payer: Self-pay | Admitting: *Deleted

## 2014-04-14 IMAGING — DX DG DXA BONE DENSITY STUDY HL7
3 series · 3 of 3 positions shown · non-contrast
Comparison: No significant change in bone density of the lumbar
spine compared to 03/20/2010.

CLINICAL DATA: Post menopausal osteoporosis screening.

[view not recorded (1 of 3)]
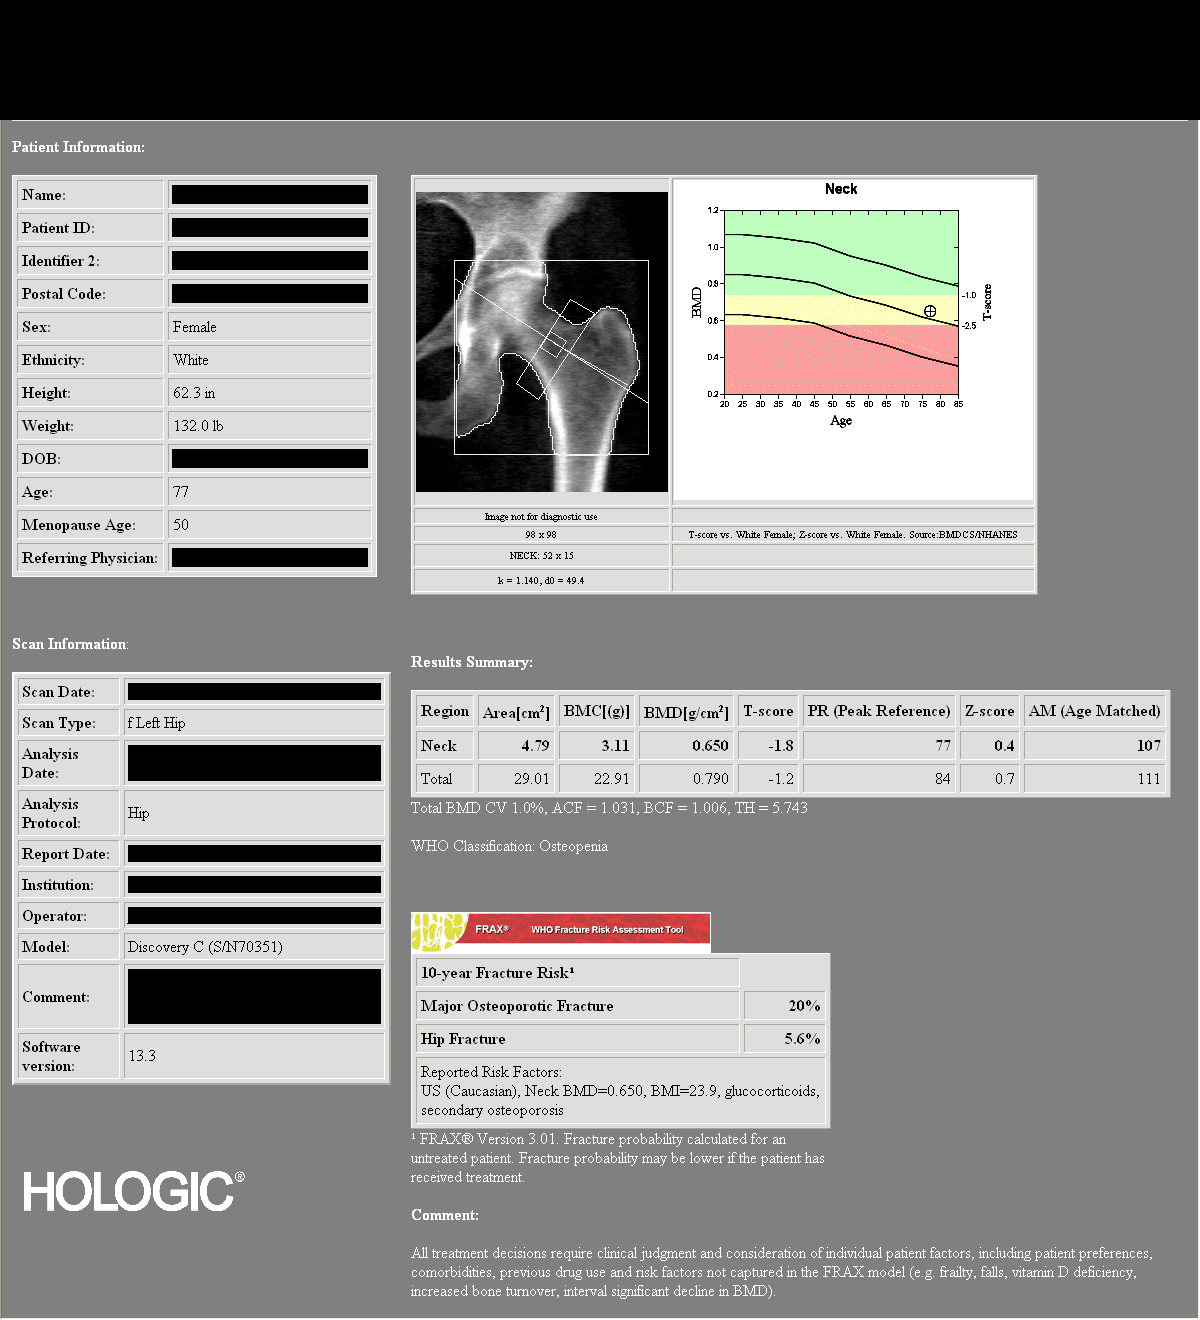

[view not recorded (2 of 3)]
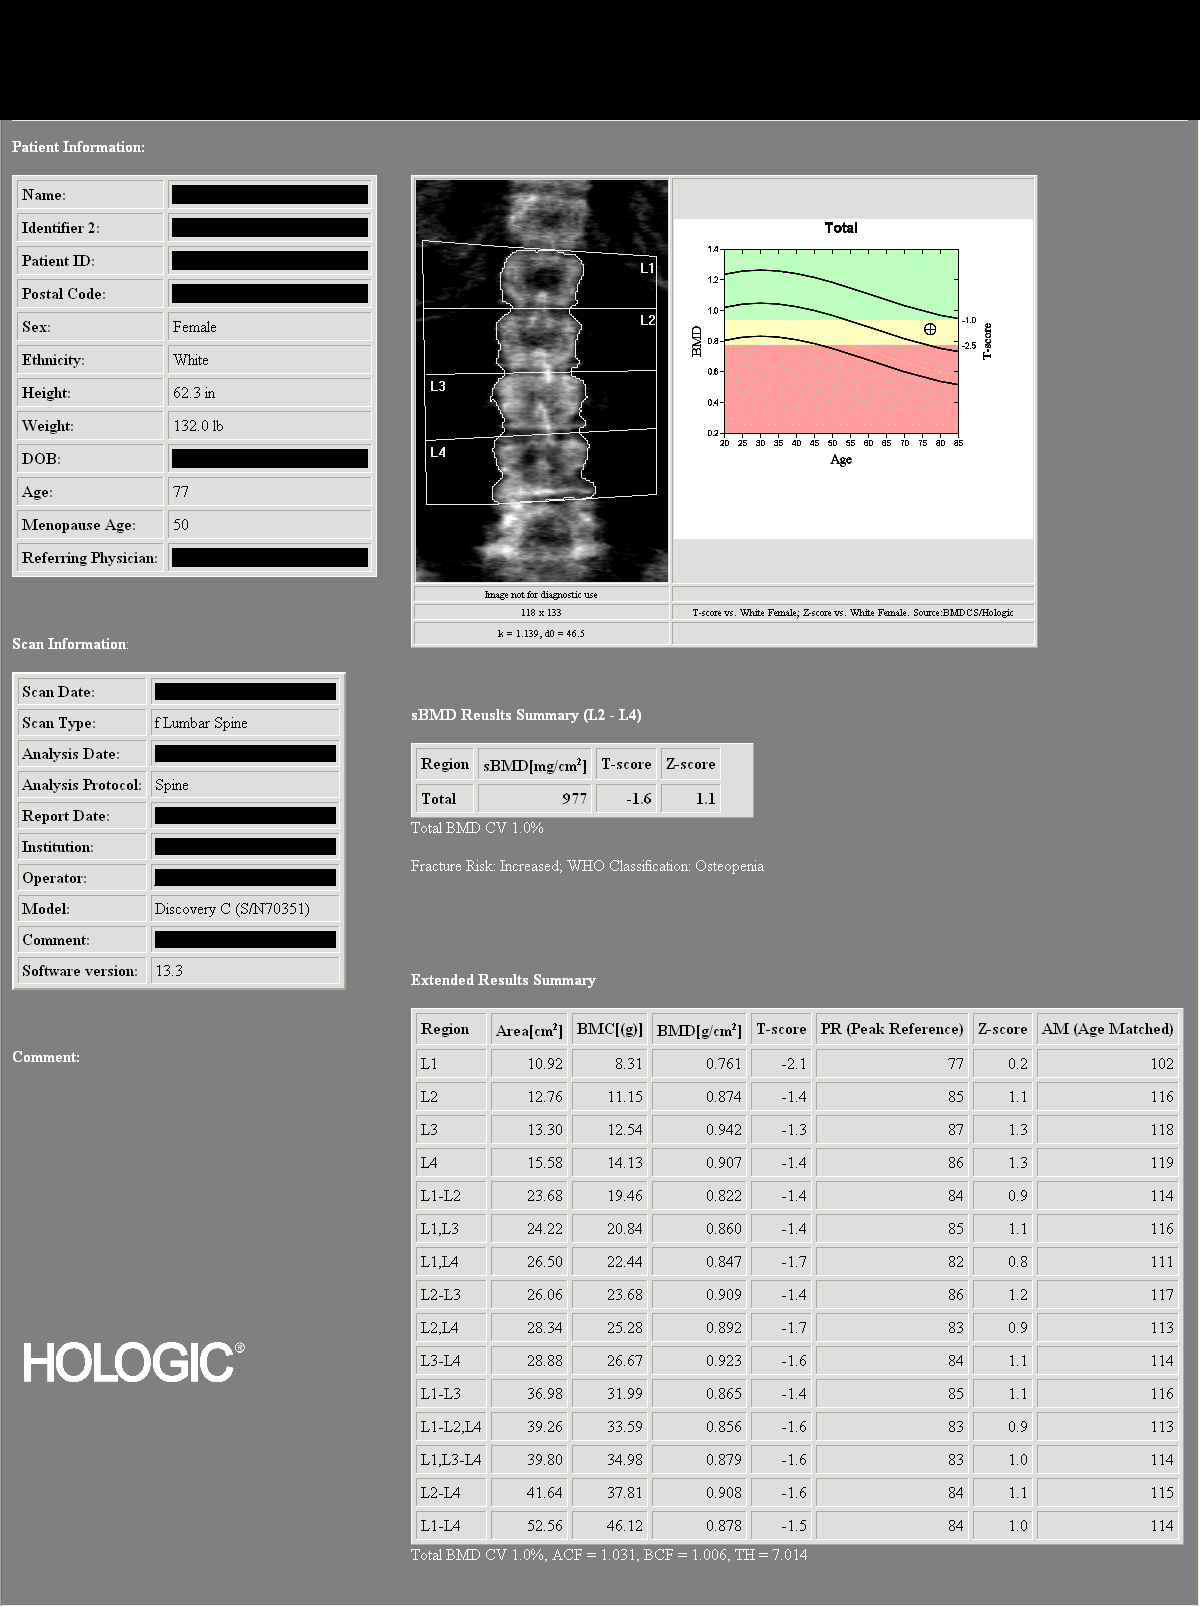

[view not recorded (3 of 3)]
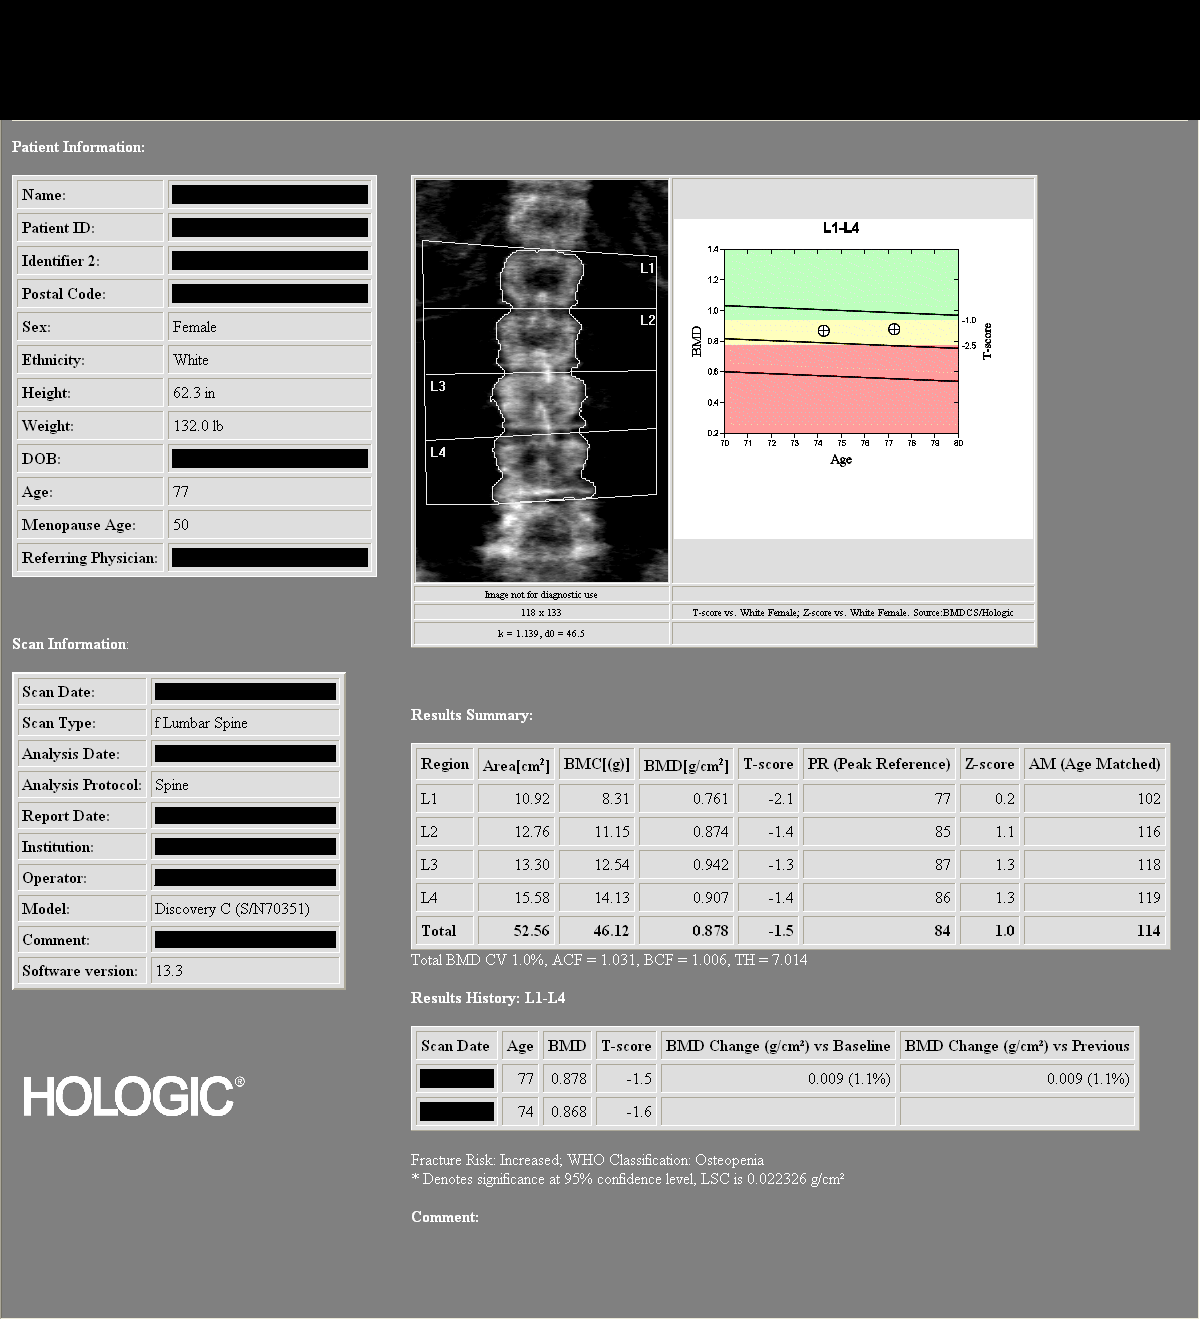

[3 of 3 positions shown; findings below may reference images not displayed]

DUAL X-RAY ABSORPTIOMETRY (DXA) FOR BONE MINERAL DENSITY

AP LUMBAR SPINE L1-L4

Bone Mineral Density (BMD):            0.878 g/cm2
Young Adult T Score:                          -1.5
Z Score:

LEFT FEMUR NECK

Bone Mineral Density (BMD):             0.650 g/cm2
Young Adult T Score:                           -1.8
Z Score:

ASSESSMENT:  Patient's diagnostic category is LOW BONE MASS by WHO
Criteria.

FRACTURE RISK: INCREASED

FRAX: Based on the World Health Organization FRAX model, the 10
year probability of a major osteoporotic fracture is 20%.  The 10
year probability of a hip fracture is 5.6%.
No significant change in bone
density over the total left hip compared to 5255.

RECOMMENDATIONS:

Effective therapies are available in the form of bisphosphonates,
selective estrogen receptor modulators, biologic agents, and
hormone replacement therapy (for women).  All patients should
ensure an adequate intake of dietary calcium (1200mg daily) and
vitamin D (800 Neha Gottesman) unless contraindicated.

All treatment decisions require clinical judgement and
consideration of individual patient factors, including patient
preferences, co-morbidities, previous drug use, risk factors not
captured in the FRAX model (e.g., frailty, falls, vitamin D
deficiency, increased bone turnover, interval significant decline
in bone density) and possible under-or over-estimation of fracture
risk by FRAX.

The National Osteoporosis Foundation recommends that FDA-approved
medical therapies be considered in postmenopausal women and mean
age 50 or older with a:

      1)     Hip or vertebral (clinical or morphometric) fracture.

2)    T-score of -2.5 or lower at the spine or hip.
3)    Ten-year fracture probability by FRAX of 3% or greater for
hip fracture or 20% or greater for major osteoporotic fracture.
FOLLOW-UP:

People with diagnosed cases of osteoporosis or at high risk for
fracture should have regular bone mineral density tests.  For
patients eligible for Medicare, routine testing is allowed once
every 2 years.  The testing frequency can be increased to one year
for patients who have rapidly progressing disease, those who are
receiving or discontinuing medical therapy to restore bone mass, or
have additional risk factors.

World Health Organization (WHO) Criteria:

Normal: T scores from +1.0 to -1.0
Low Bone Mass (Osteopenia): T scores between -1.0 and -2.5
Osteoporosis: T scores -2.5 and below

Comparison to Reference Population:

T score is the key measure used in the diagnosis of osteoporosis
and relative risk determination for fracture.  It provides a value
for bone mass relative to the mean bone mass of a young adult
reference population expressed in terms of standard deviation (SD).

Z score is the age-matched score showing the patient's values
compared to a population matched for age, sex, and race.  This is
also expressed in terms of standard deviation.  The patient may
have values that compare favorably to the age-matched values and
still be at increased risk for fracture.

## 2014-06-24 DIAGNOSIS — H8110 Benign paroxysmal vertigo, unspecified ear: Secondary | ICD-10-CM | POA: Diagnosis not present

## 2014-06-24 DIAGNOSIS — R42 Dizziness and giddiness: Secondary | ICD-10-CM | POA: Diagnosis not present

## 2014-09-21 DIAGNOSIS — Z23 Encounter for immunization: Secondary | ICD-10-CM | POA: Diagnosis not present

## 2015-01-14 ENCOUNTER — Ambulatory Visit: Payer: Medicare Other | Admitting: Family Medicine

## 2015-01-17 ENCOUNTER — Ambulatory Visit (INDEPENDENT_AMBULATORY_CARE_PROVIDER_SITE_OTHER): Payer: Medicare Other | Admitting: Family Medicine

## 2015-01-17 ENCOUNTER — Encounter: Payer: Self-pay | Admitting: Family Medicine

## 2015-01-17 VITALS — BP 106/66 | HR 73 | Ht 62.0 in | Wt 131.0 lb

## 2015-01-17 DIAGNOSIS — H8112 Benign paroxysmal vertigo, left ear: Secondary | ICD-10-CM | POA: Diagnosis not present

## 2015-01-17 NOTE — Progress Notes (Signed)
Subjective:    Patient ID: Carmen Casey, female    DOB: Jun 28, 1933, 81 y.o.   MRN: [id number]  HPI Hx of recurrent Vertigo. Last one was in December. Says usually start when wakes up with it in the AM and then does her exercises.  Does [place] a med as well. She was seen in [facility] for this. Says when happens she cannot stand up as she loses her balance. Has been doing exercises given to her by doc in [place] and that does seem to help some.  Still has mild sxs today but very mild.  Has had dry heaves with it at times.  No ear pain or hearing loss.  She otherwise feels she is doing well. She's been walking for exercise and feels very physically fit. No recent headaches or other changes.  Review of Systems  BP 106/66 mmHg  Pulse 73  Ht 5\' 2"  (1.575 m)  Wt 131 lb (59.421 kg)  BMI 23.95 kg/m2  SpO2 94%    Allergies  Allergen Reactions  . Iodine     REACTION: itching    Past Medical History  Diagnosis Date  . Shingles 2005  . Basal cell carcinoma of forehead   . Eye injury     Right eye as a child.     Past Surgical History  Procedure Laterality Date  . Cataract extraction, bilateral  2000    History   Social History  . Marital Status: Single    Spouse Name: N/A    Number of Children: N/A  . Years of Education: N/A   Occupational History  . missionary.     Social History Main Topics  . Smoking status: Former Smoker    Quit date: 12/24/1960  . Smokeless tobacco: Not on file  . Alcohol Use: No  . Drug Use: No  . Sexual Activity: No   Other Topics Concern  . Not on file   Social History Narrative   [employer], Ph.D.  Mother lives in a retirement home here in [place].  Travels around the world.  Single and never had a sexual partner. Quit smoking 1962, no drugs, no EtOH, 1 caffeinated drink per day, Regular exercise, including walking and golf.  Water exercising.            Family History  Problem Relation Age of Onset  . Hyperlipidemia  Mother   . Hypertension Mother   . Stroke Mother 28  . Alcohol abuse Father   . Hyperlipidemia Sister     Outpatient Encounter Prescriptions as of 01/17/2015  Medication Sig  . simvastatin (ZOCOR) 40 MG tablet TAKE 1/2 TABLET BY MOUTH EVERY NIGHT AT BEDTIME          Objective:   Physical Exam  Constitutional: She is oriented to person, place, and time. She appears well-developed and well-nourished.  HENT:  Head: Normocephalic and atraumatic.  Right Ear: External ear normal.  Left Ear: External ear normal.  Nose: Nose normal.  Mouth/Throat: Oropharynx is clear and moist.  TMs and canals are clear.   Eyes: Conjunctivae and EOM are normal. Pupils are equal, round, and reactive to light.  Neck: Neck supple. No thyromegaly present.  Cardiovascular: Normal rate, regular rhythm and normal heart sounds.   Pulmonary/Chest: Effort normal and breath sounds normal. She has no wheezes.  Musculoskeletal:  Dix-Hallpike did increase some of her symptoms especially on the left compared to the right. But no nystagmus seen on exam.  Lymphadenopathy:  She has no cervical adenopathy.  Neurological: She is alert and oriented to person, place, and time.  Skin: Skin is warm and dry.  Psychiatric: She has a normal mood and affect.          Assessment & Plan:  BPPV - no nystagmus on exam today. Recommend eply manuver.  Ok to continue meclizine.  If not improving in the next 3-4 weeks and let me know. Consider further workup at that time. She's had multiple episodes since April and still feels like she has a little low-grade underlying vertigo some days. Explained sometimes we need physical therapy and sometimes it does require additional workup.  I did ask her to call sooner if she notices that they're increasing in duration or frequency or change in nature.

## 2015-02-04 DIAGNOSIS — Z23 Encounter for immunization: Secondary | ICD-10-CM | POA: Diagnosis not present

## 2015-04-16 ENCOUNTER — Other Ambulatory Visit: Payer: Self-pay | Admitting: Family Medicine

## 2015-05-10 ENCOUNTER — Ambulatory Visit (INDEPENDENT_AMBULATORY_CARE_PROVIDER_SITE_OTHER): Payer: Medicare Other | Admitting: Family Medicine

## 2015-05-10 ENCOUNTER — Encounter: Payer: Self-pay | Admitting: Family Medicine

## 2015-05-10 VITALS — BP 114/63 | HR 54 | Ht 62.0 in | Wt 129.0 lb

## 2015-05-10 DIAGNOSIS — E7800 Pure hypercholesterolemia, unspecified: Secondary | ICD-10-CM

## 2015-05-10 DIAGNOSIS — Z0000 Encounter for general adult medical examination without abnormal findings: Secondary | ICD-10-CM | POA: Diagnosis not present

## 2015-05-10 DIAGNOSIS — Z1231 Encounter for screening mammogram for malignant neoplasm of breast: Secondary | ICD-10-CM

## 2015-05-10 NOTE — Progress Notes (Addendum)
Subjective:   Carmen Casey is a 81 y.o. female who presents for Medicare Annual (Subsequent) preventive examination.  Review of Systems:  Negative        Objective:     Vitals: BP 114/63 mmHg  Pulse 54  Ht 5\' 2"  (1.575 m)  Wt 129 lb (58.514 kg)  BMI 23.59 kg/m2  Tobacco History  Smoking status  . Former Smoker  . Quit date: 12/24/1960  Smokeless tobacco  . Not on file     Counseling given: Not Answered   Past Medical History  Diagnosis Date  . Shingles 2005  . Basal cell carcinoma of forehead   . Eye injury     Right eye as a child.    Past Surgical History  Procedure Laterality Date  . Cataract extraction, bilateral  2000   Family History  Problem Relation Age of Onset  . Hyperlipidemia Mother   . Hypertension Mother   . Stroke Mother 53  . Alcohol abuse Father   . Hyperlipidemia Sister    History  Sexual Activity  . Sexual Activity: No    Outpatient Encounter Prescriptions as of 05/10/2015  Medication Sig  . simvastatin (ZOCOR) 40 MG tablet TAKE 1/2 TABLET BY MOUTH EVERY NIGHT AT BEDTIME  . [DISCONTINUED] PREVNAR 13 SUSP injection    No facility-administered encounter medications on file as of 05/10/2015.    Activities of Daily Living In your present state of health, do you have any difficulty performing the following activities: 05/10/2015  Hearing? N  Vision? N  Difficulty concentrating or making decisions? N  Walking or climbing stairs? N  Dressing or bathing? N  Doing errands, shopping? N     6 CIT  score of 4 out of 28, normal.  Patient Care Team: Hali Marry, MD as PCP - General Juliann Mule, MD as Referring Physician (Ophthalmology)    Assessment:    Medicare Wellness exam  Exercise Activities and Dietary recommendations    Goals    None     Fall Risk Fall Risk  05/10/2015 04/01/2014 03/19/2013  Falls in the past year? No No Yes  Number falls in past yr: - - 1  Injury with Fall? - - No   Depression  Screen PHQ 2/9 Scores 05/10/2015 04/01/2014  PHQ - 2 Score 0 0     Cognitive Testing No flowsheet data found.  Immunization History  Administered Date(s) Administered  . Influenza Split 10/24/2012  . Influenza Whole 10/24/2005, 12/30/2006, 10/24/2009  . Influenza,inj,Quad PF,36+ Mos 09/23/2014  . Influenza-Unspecified 09/23/2013  . Pneumococcal Conjugate-13 02/04/2015  . Pneumococcal Polysaccharide-23 12/30/2006  . Td 12/30/2006  . Zoster 10/24/2012   Screening Tests Health Maintenance  Topic Date Due  . INFLUENZA VACCINE  07/25/2015  . TETANUS/TDAP  12/30/2016  . COLONOSCOPY  01/31/2017  . DEXA SCAN  Completed  . ZOSTAVAX  Completed  . PNA vac Low Risk Adult  Completed      Plan:    Medicare Wellness Exam  During the course of the visit the patient was educated and counseled about the following appropriate screening and preventive services:   Vaccines to include Pneumoccal, Influenza, Hepatitis B, Td, Zostavax, HCV  Colorectal cancer screening - UTD.   Bone density screening - she wants to wait until next year to repeat.   Diabetes screening - glucose has been normal.   Glaucoma screening - due for yearly eye exam.   Nutrition counseling - not recommended.   Patient Instructions (the  written plan) was given to the patient.   METHENEY,CATHERINE, MD  05/10/2015

## 2015-05-10 NOTE — Patient Instructions (Signed)
Keep up a regular exercise program and make sure you are eating a healthy diet Try to eat 4 servings of dairy a day, or if you are lactose intolerant take a calcium with vitamin D daily.  Your vaccines are up to date.   

## 2015-05-11 LAB — COMPLETE METABOLIC PANEL WITH GFR
ALBUMIN: 4.6 g/dL (ref 3.5–5.2)
ALK PHOS: 73 U/L (ref 39–117)
ALT: 17 U/L (ref 0–35)
AST: 19 U/L (ref 0–37)
BUN: 10 mg/dL (ref 6–23)
CALCIUM: 9.1 mg/dL (ref 8.4–10.5)
CHLORIDE: 102 meq/L (ref 96–112)
CO2: 25 mEq/L (ref 19–32)
CREATININE: 0.57 mg/dL (ref 0.50–1.10)
GFR, EST NON AFRICAN AMERICAN: 88 mL/min
GLUCOSE: 86 mg/dL (ref 70–99)
Potassium: 4.2 mEq/L (ref 3.5–5.3)
Sodium: 140 mEq/L (ref 135–145)
TOTAL PROTEIN: 6.8 g/dL (ref 6.0–8.3)
Total Bilirubin: 0.7 mg/dL (ref 0.2–1.2)

## 2015-05-11 LAB — LIPID PANEL
CHOL/HDL RATIO: 3.5 ratio
Cholesterol: 188 mg/dL (ref 0–200)
HDL: 53 mg/dL (ref >=46)
LDL CALC: 110 mg/dL — AB (ref 0–99)
Triglycerides: 123 mg/dL (ref <150)
VLDL: 25 mg/dL (ref 0–40)

## 2015-11-02 DIAGNOSIS — Z23 Encounter for immunization: Secondary | ICD-10-CM | POA: Diagnosis not present

## 2016-01-10 ENCOUNTER — Other Ambulatory Visit: Payer: Self-pay | Admitting: Family Medicine

## 2016-01-10 DIAGNOSIS — E7800 Pure hypercholesterolemia, unspecified: Secondary | ICD-10-CM

## 2016-01-10 NOTE — Progress Notes (Signed)
Pt requested lab recheck, order has been placed.

## 2016-01-11 LAB — LIPID PANEL
CHOL/HDL RATIO: 2.2 ratio (ref <=5.0)
Cholesterol: 168 mg/dL (ref 125–200)
HDL: 75 mg/dL (ref >=46)
LDL CALC: 76 mg/dL (ref <130)
Triglycerides: 86 mg/dL (ref <150)
VLDL: 17 mg/dL (ref <30)

## 2016-04-05 ENCOUNTER — Other Ambulatory Visit: Payer: Self-pay | Admitting: *Deleted

## 2016-04-05 MED ORDER — SIMVASTATIN 40 MG PO TABS: ORAL_TABLET | ORAL | Status: DC

## 2016-04-09 ENCOUNTER — Other Ambulatory Visit: Payer: Self-pay | Admitting: *Deleted

## 2016-04-09 MED ORDER — SIMVASTATIN 40 MG PO TABS: ORAL_TABLET | ORAL | Status: DC

## 2016-04-11 ENCOUNTER — Other Ambulatory Visit: Payer: Self-pay | Admitting: *Deleted

## 2016-04-11 MED ORDER — SIMVASTATIN 40 MG PO TABS: 40.0000 mg | ORAL_TABLET | Freq: Every day | ORAL | Status: DC

## 2016-04-12 ENCOUNTER — Other Ambulatory Visit: Payer: Self-pay | Admitting: *Deleted

## 2016-04-12 MED ORDER — SIMVASTATIN 40 MG PO TABS: ORAL_TABLET | ORAL | Status: DC

## 2016-04-12 NOTE — Progress Notes (Signed)
Logan and spoke w/jennifer(pharmacy tech) to clarify the dose and quantity for Simvastatin 40 mg. Gave permission to change to  1/2 tab QHS #45 with 2 refills. This was also verified by Baker Janus (pharmacist).Audelia Hives [place]

## 2016-05-14 ENCOUNTER — Ambulatory Visit (INDEPENDENT_AMBULATORY_CARE_PROVIDER_SITE_OTHER): Payer: Commercial Managed Care - HMO | Admitting: Family Medicine

## 2016-05-14 ENCOUNTER — Encounter: Payer: Self-pay | Admitting: Family Medicine

## 2016-05-14 ENCOUNTER — Ambulatory Visit (INDEPENDENT_AMBULATORY_CARE_PROVIDER_SITE_OTHER): Payer: Commercial Managed Care - HMO

## 2016-05-14 VITALS — BP 121/54 | HR 61 | Wt 131.0 lb

## 2016-05-14 DIAGNOSIS — R1310 Dysphagia, unspecified: Secondary | ICD-10-CM

## 2016-05-14 DIAGNOSIS — E010 Iodine-deficiency related diffuse (endemic) goiter: Secondary | ICD-10-CM

## 2016-05-14 DIAGNOSIS — E042 Nontoxic multinodular goiter: Secondary | ICD-10-CM | POA: Diagnosis not present

## 2016-05-14 DIAGNOSIS — Z8269 Family history of other diseases of the musculoskeletal system and connective tissue: Secondary | ICD-10-CM | POA: Insufficient documentation

## 2016-05-14 DIAGNOSIS — E041 Nontoxic single thyroid nodule: Secondary | ICD-10-CM

## 2016-05-14 DIAGNOSIS — E049 Nontoxic goiter, unspecified: Secondary | ICD-10-CM | POA: Diagnosis not present

## 2016-05-14 DIAGNOSIS — Z0000 Encounter for general adult medical examination without abnormal findings: Secondary | ICD-10-CM

## 2016-05-14 DIAGNOSIS — H8112 Benign paroxysmal vertigo, left ear: Secondary | ICD-10-CM

## 2016-05-14 DIAGNOSIS — E7800 Pure hypercholesterolemia, unspecified: Secondary | ICD-10-CM

## 2016-05-14 DIAGNOSIS — H8110 Benign paroxysmal vertigo, unspecified ear: Secondary | ICD-10-CM

## 2016-05-14 NOTE — Patient Instructions (Signed)
Mammogram A mammogram is an X-ray of the breasts that is done to check for abnormal changes. This procedure can screen for and detect any changes that may suggest breast cancer. A mammogram can also identify other changes and variations in the breast, such as:  Inflammation of the breast tissue (mastitis).  An infected area that contains a collection of pus (abscess).  A fluid-filled sac (cyst).  Fibrocystic changes. This is when breast tissue becomes denser, which can make the tissue feel rope-like or uneven under the skin.  Tumors that are not cancerous (benign). LET YOUR HEALTH CARE PROVIDER KNOW ABOUT:  Any allergies you have.  If you have breast implants.  If you have had previous breast disease, biopsy, or surgery.  If you are breastfeeding.  Any possibility that you could be pregnant, if this applies.  If you are younger than age 25.  If you have a family history of breast cancer. RISKS AND COMPLICATIONS Generally, this is a safe procedure. However, problems may occur, including:  Exposure to radiation. Radiation levels are very low with this test.  The results being misinterpreted.  The need for further tests.  The inability of the mammogram to detect certain cancers. BEFORE THE PROCEDURE  Schedule your test about 1-2 weeks after your menstrual period. This is usually when your breasts are the least tender.  If you have had a mammogram done at a different facility in the past, get the mammogram X-rays or have them sent to your current exam facility in order to compare them.  Wash your breasts and under your arms the day of the test.  Do not wear deodorants, perfumes, lotions, or powders anywhere on your body on the day of the test.  Remove any jewelry from your neck.  Wear clothes that you can change into and out of easily. PROCEDURE  You will undress from the waist up and put on a gown.  You will stand in front of the X-ray machine.  Each breast will  be placed between two plastic or glass plates. The plates will compress your breast for a few seconds. Try to stay as relaxed as possible during the procedure. This does not cause any harm to your breasts and any discomfort you feel will be very brief.  X-rays will be taken from different angles of each breast. The procedure may vary among health care providers and hospitals. AFTER THE PROCEDURE  The mammogram will be examined by a specialist (radiologist).  You may need to repeat certain parts of the test, depending on the quality of the images. This is commonly done if the radiologist needs a better view of the breast tissue.  Ask when your test results will be ready. Make sure you get your test results.  You may resume your normal activities.   This information is not intended to replace advice given to you by your health care provider. Make sure you discuss any questions you have with your health care provider.   Document Released: 12/07/2000 Document Revised: 08/31/2015 Document Reviewed: 02/18/2015 Elsevier Interactive Patient Education 2016 [employer]  

## 2016-05-14 NOTE — Progress Notes (Signed)
Subjective:    Carmen Casey is a 82 y.o. female who presents for Medicare Annual/Subsequent preventive examination.  Preventive Screening-Counseling & Management  Tobacco History  Smoking status  . Former Smoker  . Quit date: 12/24/1960  Smokeless tobacco  . Not on file     Problems Prior to Visit 1.  Patient complains of difficulty swallowing at the back of her throat. She wonders if it could be related to her polymyositis. She was initially diagnosed in 1999 and feels like she does occasionally have some flares where she'll get extreme fatigue heaviness and aching of the muscles. She says she started to notice some difficulty even with fluids and has actually choked a couple of times. She has complained of feeling more muscle aches recently. 2.  Her vertigo is much better. She says about the only time she notices it now is sometimes when she swims for exercise when she gets out she'll feel a little dizzy. She just does the exercises that I gave her and that usually resolves it fairly quickly. 3. Diet/exercise - she has decreased his sugar intake and increased protein. And her cholesterol looks great. She's been walking 10,000 steps per day and swimming regularly. 4. She is getting new hearing aids today.  Current Problems (verified) Patient Active Problem List   Diagnosis Date Noted  . KERATOSIS, ACTINIC 01/07/2007  . NEOPLASM, SKIN, UNCERTAIN BEHAVIOR [id number]  . HYPERCHOLESTEROLEMIA, MILD 12/30/2006    Medications Prior to Visit Current Outpatient Prescriptions on File Prior to Visit  Medication Sig Dispense Refill  . simvastatin (ZOCOR) 40 MG tablet 1/2 TABLET ONCE A DAY BY MOUTH AT BEDTIME 45 tablet 2   No current facility-administered medications on file prior to visit.    Current Medications (verified) Current Outpatient Prescriptions  Medication Sig Dispense Refill  . simvastatin (ZOCOR) 40 MG tablet 1/2 TABLET ONCE A DAY BY MOUTH AT BEDTIME 45 tablet 2   No  current facility-administered medications for this visit.     Allergies (verified) Iodine   PAST HISTORY  Family History Family History  Problem Relation Age of Onset  . Hyperlipidemia Mother   . Hypertension Mother   . Stroke Mother 41  . Alcohol abuse Father   . Hyperlipidemia Sister     Social History Social History  Substance Use Topics  . Smoking status: Former Smoker    Quit date: 12/24/1960  . Smokeless tobacco: Not on file  . Alcohol Use: No     Are there smokers in your home (other than you)? No  Risk Factors Current exercise habits: swims and walks 10000 steps per day.  Dietary issues discussed: she is eating more proteine and dec her sugar intake.  Her last cholesterol was much better.     Cardiac risk factors: advanced age (older than 78 for men, 10 for women).  Depression Screen (Note: if answer to either of the following is "Yes", a more complete depression screening is indicated)   Over the past two weeks, have you felt down, depressed or hopeless? No  Over the past two weeks, have you felt little interest or pleasure in doing things? No  Have you lost interest or pleasure in daily life? No  Do you often feel hopeless? No  Do you cry easily over simple problems? No  Activities of Daily Living In your present state of health, do you have any difficulty performing the following activities?:  Driving? No Managing money?  No Feeding yourself? No Getting from bed to  chair? No   Climbing a flight of stairs? No Preparing food and eating?: No Bathing or showering? No Getting dressed: No Getting to the toilet? No Using the toilet:No Moving around from place to place: No In the past year have you fallen or had a near fall?:No   Are you sexually active?  No  Do you have more than one partner?  No  Hearing Difficulties: Yes Do you often ask people to speak up or repeat themselves? Yes Do you experience ringing or noises in your ears? Yes Do you have  difficulty understanding soft or whispered voices? Yes   Do you feel that you have a problem with memory? Yes  Do you often misplace items? No  Do you feel safe at home?  Yes  Cognitive Testing  Alert? Yes  Normal Appearance?Yes  Oriented to person? Yes  Place? Yes   Time? Yes  Recall of three objects?  Yes  Can perform simple calculations? Yes  Displays appropriate judgment?Yes  Can read the correct time from a watch face?Yes   Advanced Directives have been discussed with the patient? Yes  List the Names of Other Physician/Practitioners you currently use: 1.  None  Indicate any recent Medical Services you may have received from other than [facility] providers in the past year (date may be approximate).  Immunization History  Administered Date(s) Administered  . Influenza Split 10/24/2012  . Influenza Whole 10/24/2005, 12/30/2006, 10/24/2009  . Influenza,inj,Quad PF,36+ Mos 09/23/2014  . Influenza-Unspecified 09/23/2013  . Pneumococcal Conjugate-13 02/04/2015  . Pneumococcal Polysaccharide-23 12/30/2006  . Td 12/30/2006  . Zoster 10/24/2012    Screening Tests Health Maintenance  Topic Date Due  . INFLUENZA VACCINE  07/24/2016  . TETANUS/TDAP  12/30/2016  . DEXA SCAN  Completed  . ZOSTAVAX  Completed  . PNA vac Low Risk Adult  Completed    All answers were reviewed with the patient and necessary referrals were made:  Carmen Virgen, MD   05/14/2016   History reviewed: allergies, current medications, past family history, past medical history, past social history, past surgical history and problem list  Review of Systems A comprehensive review of systems was negative.    Objective:     Vision by Snellen chart: right eye:20/100, left eye:20/15  Body mass index is 23.95 kg/(m^2). BP 121/54 mmHg  Pulse 61  Wt 131 lb (59.421 kg)  SpO2 99%  BP 121/54 mmHg  Pulse 61  Wt 131 lb (59.421 kg)  SpO2 99% General appearance: alert, cooperative and appears stated  age Head: Normocephalic, without obvious abnormality, atraumatic Eyes: conj clear, EOMI, PEERLA Ears: normal TM's and external ear canals both ears Nose: Nares normal. Septum midline. Mucosa normal. No drainage or sinus tenderness. Throat: lips, mucosa, and tongue normal; teeth and gums normal Neck: no adenopathy, no carotid bruit, no JVD, supple, symmetrical, trachea midline and thyroid enlarged but symmetric Back: symmetric, no curvature. ROM normal. No CVA tenderness. Lungs: clear to auscultation bilaterally Breasts: normal appearance, no masses or tenderness Heart: regular rate and rhythm, S1, S2 normal, no murmur, click, rub or gallop Abdomen: soft, non-tender; bowel sounds normal; no masses,  no organomegaly Extremities: extremities normal, atraumatic, no cyanosis or edema Pulses: 2+ and symmetric Skin: Skin color, texture, turgor normal. No rashes or lesions Lymph nodes: Cervical, supraclavicular, and axillary nodes normal. Neurologic: Alert and oriented X 3, normal strength and tone. Normal symmetric reflexes. Normal coordination and gait     Assessment:     Medicare Wellness Exam  Plan:     During the course of the visit the patient was educated and counseled about appropriate screening and preventive services including:    Dysphagia - refer for EGD . Unfortunately she is sleeping town and will not be back until August. 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 She wants to wait on her referral to gastroneurology until she gets back. Encouraged her to chew her food very well and make sure she stays hydrated and to let us know if it's getting worse sooner.  Myalgias-not sure if related to her prior history of polymyositis or a side effect from the statins. For now she says she is comfortable with continuing her statin but will let me know if it gets worse.  Hearing loss-getting new hearing aids today.  Vertigo-improved but not resolved. She has an effective treatment when she  does get episodes.  Thyromegaly-her thyroid is mildly enlarged on exam today. We'll schedule for ultrasound and check a thyroid level.    Diet review for nutrition referral? Yes ____  Not Indicated _x___   Patient Instructions (the written plan) was given to the patient.  Medicare Attestation I have personally reviewed: The patient's medical and social history Their use of alcohol, tobacco or illicit drugs Their current medications and supplements The patient's functional ability including ADLs,fall risks, home safety risks, cognitive, and hearing and visual impairment Diet and physical activities Evidence for depression or mood disorders  The patient's weight, height, BMI, and visual acuity have been recorded in the chart.  I have made referrals, counseling, and provided education to the patient based on review of the above and I have provided the patient with a written personalized care plan for preventive services.     Everlie Eble, MD   05/14/2016

## 2016-05-15 LAB — TSH: TSH: 0.98 mIU/L

## 2016-05-15 LAB — COMPLETE METABOLIC PANEL WITH GFR
ALT: 13 U/L (ref 6–29)
AST: 17 U/L (ref 10–35)
Albumin: 4.5 g/dL (ref 3.6–5.1)
Alkaline Phosphatase: 66 U/L (ref 33–130)
BILIRUBIN TOTAL: 0.6 mg/dL (ref 0.2–1.2)
BUN: 8 mg/dL (ref 7–25)
CHLORIDE: 102 mmol/L (ref 98–110)
CO2: 25 mmol/L (ref 20–31)
CREATININE: 0.63 mg/dL (ref 0.60–0.88)
Calcium: 9 mg/dL (ref 8.6–10.4)
GFR, EST NON AFRICAN AMERICAN: 85 mL/min (ref >=60)
GLUCOSE: 85 mg/dL (ref 65–99)
Potassium: 4.7 mmol/L (ref 3.5–5.3)
SODIUM: 139 mmol/L (ref 135–146)
TOTAL PROTEIN: 6.3 g/dL (ref 6.1–8.1)

## 2016-05-15 LAB — CBC
HEMATOCRIT: 41.2 % (ref 35.0–45.0)
HEMOGLOBIN: 14.1 g/dL (ref 11.7–15.5)
MCH: 31.3 pg (ref 27.0–33.0)
MCHC: 34.2 g/dL (ref 32.0–36.0)
MCV: 91.6 fL (ref 80.0–100.0)
MPV: 11.3 fL (ref 7.5–12.5)
Platelets: 197 10*3/uL (ref 140–400)
RBC: 4.5 MIL/uL (ref 3.80–5.10)
RDW: 13.1 % (ref 11.0–15.0)
WBC: 5.8 10*3/uL (ref 3.8–10.8)

## 2016-05-29 ENCOUNTER — Other Ambulatory Visit: Payer: Self-pay | Admitting: Family Medicine

## 2016-06-15 ENCOUNTER — Encounter: Payer: Self-pay | Admitting: Internal Medicine

## 2016-08-23 ENCOUNTER — Ambulatory Visit (INDEPENDENT_AMBULATORY_CARE_PROVIDER_SITE_OTHER): Payer: Commercial Managed Care - HMO | Admitting: Internal Medicine

## 2016-08-23 ENCOUNTER — Encounter: Payer: Self-pay | Admitting: Internal Medicine

## 2016-08-23 VITALS — BP 114/68 | HR 67 | Ht 62.25 in | Wt 128.0 lb

## 2016-08-23 DIAGNOSIS — R1310 Dysphagia, unspecified: Secondary | ICD-10-CM

## 2016-08-23 NOTE — Patient Instructions (Signed)
  You have been scheduled for an endoscopy. Please follow written instructions given to you at your visit today. If you use inhalers (even only as needed), please bring them with you on the day of your procedure.   I appreciate the opportunity to care for you. Carl Gessner, MD, FACG 

## 2016-08-23 NOTE — Progress Notes (Signed)
  Referred RN:[id number] Devin Going, [address], [place] [address]   Assessment & Plan:   Encounter Diagnosis  Name Primary?  [name] Dysphagia Yes   Diff Dx is stricture vs motility problems. Cancer not likely w/ hx and lack of weight loss  Plan is EGD and likely esophageal dilation. The risks and benefits as well as alternatives of endoscopic procedure(s) have been discussed and reviewed. All questions answered. The patient agrees to proceed.  May need PPI - discussed rationale to reduce chance of stricture recurrence if that is the problem as almost always due to GERD.     Subjective:    Patient ID: Carmen Casey, female    DOB: 1933/10/02, 82 y.o.   MRN: [id number] Cc: dysphagia HPI   Elderly ww, retired Statistician, with several months or longer of swallowing problems. Choking when she eats.  Began eating slowly - helped but recurrent problems with solid> liquid dysphagia - food impaction and tightness in chest, sobv, scary  No weight loss.  No heartburn.  Had EGD in [place] 2005. Colonoscopy negative 2006 - me  Very active walks > 1 mi daily and does all ADL's Medications, allergies, past medical history, past surgical history, family history and social history are reviewed and updated in the EMR.  Review of Systems As per HPI Uses hearing aids All other ROS negative    Objective:   Physical Exam @BP  114/68   Pulse 67   Ht 5' 2.25" (1.581 m)   Wt 128 lb (58.1 kg)   BMI 23.22 kg/m @  General:  Well-developed, well-nourished and in no acute distress Eyes:  anicteric. ENT:   Mouth and posterior pharynx free of lesions. Teeth in decent shape/intact Neck:   supple w/o thyromegaly or mass.  Lungs: Clear to auscultation bilaterally. Heart:  S1S2, no rubs, murmurs, gallops. Abdomen:  soft, non-tender, no hepatosplenomegaly, hernia, or mass and BS+.  Lymph:  no cervical or supraclavicular adenopathy. Extremities:   no edema,  cyanosis or clubbing Skin   no rash. Neuro:  A&O x 3.  Psych:  appropriate mood and  Affect.   Data Reviewed:  PCP note 04/2016 Wt Readings from Last 3 Encounters:  08/23/16 128 lb (58.1 kg)  05/14/16 131 lb (59.4 kg)  05/10/15 129 lb (58.5 kg)   NL CBC, TSH, CMET 2017    I appreciate the opportunity to care for this patient. RE:[id number], MD

## 2016-08-24 ENCOUNTER — Ambulatory Visit (AMBULATORY_SURGERY_CENTER): Payer: Commercial Managed Care - HMO | Admitting: Internal Medicine

## 2016-08-24 ENCOUNTER — Encounter: Payer: Self-pay | Admitting: Internal Medicine

## 2016-08-24 VITALS — BP 130/80 | HR 85 | Temp 98.2°F | Resp 16 | Ht 62.25 in | Wt 128.0 lb

## 2016-08-24 DIAGNOSIS — K222 Esophageal obstruction: Secondary | ICD-10-CM | POA: Diagnosis not present

## 2016-08-24 DIAGNOSIS — R1310 Dysphagia, unspecified: Secondary | ICD-10-CM | POA: Diagnosis present

## 2016-08-24 MED ORDER — OMEPRAZOLE 20 MG PO CPDR: 20.0000 mg | DELAYED_RELEASE_CAPSULE | Freq: Every day | ORAL | 3 refills | Status: DC

## 2016-08-24 MED ORDER — SODIUM CHLORIDE 0.9 % IV SOLN: 500.0000 mL | INTRAVENOUS | Status: DC

## 2016-08-24 NOTE — Progress Notes (Signed)
Called to room to assist during endoscopic procedure.  Patient ID and intended procedure confirmed with present staff. Received instructions for my participation in the procedure from the performing physician.  

## 2016-08-24 NOTE — Progress Notes (Signed)
To recovery report to [facility]

## 2016-08-24 NOTE — Patient Instructions (Addendum)
There was a benign stricture (narrow area) wher the esophagus and stomach meet. This is almost always from acid reflux.  I dilated it so you can swallow better.  I am prescribing medication to block acid and help prevent recurrence of this problem.  Please call me with an update in early October to tell me how you are doing with your swallowing.  I appreciate the opportunity to care for you. Gatha Mayer, MD, FACG  YOU HAD AN ENDOSCOPIC PROCEDURE TODAY AT [facility]:   Refer to the procedure report that was given to you for any specific questions about what was found during the examination.  If the procedure report does not answer your questions, please call your gastroenterologist to clarify.  If you requested that your care partner not be given the details of your procedure findings, then the procedure report has been included in a sealed envelope for you to review at your convenience later.  YOU SHOULD EXPECT: Some feelings of bloating in the abdomen. Passage of more gas than usual.  Walking can help get rid of the air that was put into your GI tract during the procedure and reduce the bloating.   Please Note:  You might notice some irritation and congestion in your nose or some drainage.  This is from the oxygen used during your procedure.  There is no need for concern and it should clear up in a day or so.  SYMPTOMS TO REPORT IMMEDIATELY:    Following upper endoscopy (EGD)  Vomiting of blood or coffee ground material  New chest pain or pain under the shoulder blades  Painful or persistently difficult swallowing  New shortness of breath  Fever of 100F or higher  Black, tarry-looking stools  For urgent or emergent issues, a gastroenterologist can be reached at any hour by calling [phone].   DIET: Clear liquids until 1:30pm today, and then a soft diet for the rest of today.. You may have a regular diet tomorrow. Drink plenty of fluids but you  should avoid alcoholic beverages for 24 hours.  ACTIVITY:  You should plan to take it easy for the rest of today and you should NOT DRIVE or use heavy machinery until tomorrow (because of the sedation medicines used during the test).    FOLLOW UP: Our staff will call the number listed on your records the next business day following your procedure to check on you and address any questions or concerns that you may have regarding the information given to you following your procedure. If we do not reach you, we will leave a message.  However, if you are feeling well and you are not experiencing any problems, there is no need to return our call.  We will assume that you have returned to your regular daily activities without incident.  If any biopsies were taken you will be contacted by phone or by letter within the next 1-3 weeks.  Please call us at [phone] if you have not heard about the biopsies in 3 weeks.    SIGNATURES/CONFIDENTIALITY: You and/or your care partner have signed paperwork which will be entered into your electronic medical record.  These signatures attest to the fact that that the information above on your After Visit Summary has been reviewed and is understood.  Full responsibility of the confidentiality of this discharge information lies with you and/or your care-partner.  Take omeprazole 20mg  daily forever.  Read all of the handouts given to you  by your recovery room nurse.

## 2016-08-24 NOTE — Op Note (Signed)
[facility] Patient Name: Carmen Casey Procedure Date: 08/24/2016 11:51 AM MRN: [id number] Endoscopist: Gatha Mayer , MD Age: 82 Referring MD:  Date of Birth: 02-24-1933 Gender: Female Account #: [id number] Procedure:                Upper GI endoscopy Indications:              Dysphagia Medicines:                Propofol per Anesthesia, Monitored Anesthesia Care Procedure:                Pre-Anesthesia Assessment:                           - Prior to the procedure, a History and Physical                            was performed, and patient medications and                            allergies were reviewed. The patient's tolerance of                            previous anesthesia was also reviewed. The risks                            and benefits of the procedure and the sedation                            options and risks were discussed with the patient.                            All questions were answered, and informed consent                            was obtained. Prior Anticoagulants: The patient has                            taken no previous anticoagulant or antiplatelet                            agents. ASA Grade Assessment: II - A patient with                            mild systemic disease. After reviewing the risks                            and benefits, the patient was deemed in                            satisfactory condition to undergo the procedure.                           After obtaining informed consent, the endoscope was  passed under direct vision. Throughout the                            procedure, the patient's blood pressure, pulse, and                            oxygen saturations were monitored continuously. The                            Model GIF-HQ190 [id number]) scope was introduced                            through the mouth, and advanced to the second part                            of duodenum. The  upper GI endoscopy was                            accomplished without difficulty. The patient                            tolerated the procedure well. Scope In: Scope Out: Findings:                 One moderate benign-appearing, intrinsic stenosis                            was found 31 cm from the incisors. This measured                            1.3 cm (inner diameter) x less than one cm (in                            length) and was traversed. A TTS dilator was passed                            through the scope. Dilation with a 16-17-18 mm                            balloon dilator was performed to 18 mm. The                            dilation site was examined and showed moderate                            improvement in luminal narrowing. Estimated blood                            loss was minimal.                           A 5 cm hiatal hernia was present.  The exam was otherwise without abnormality.                           The cardia and gastric fundus were normal on                            retroflexion. Complications:            No immediate complications. Estimated Blood Loss:     Estimated blood loss was minimal. Impression:               - Benign-appearing esophageal stenosis. Dilated.                           - 5 cm hiatal hernia.                           - The examination was otherwise normal.                           - No specimens collected. Recommendation:           - Patient has a contact number available for                            emergencies. The signs and symptoms of potential                            delayed complications were discussed with the                            patient. Return to normal activities tomorrow.                            Written discharge instructions were provided to the                            patient.                           - Clear liquids x 1 hour then soft foods rest of                             day. Start prior diet tomorrow.                           - Use Prilosec (omeprazole) 20 mg PO daily for the                            rest of the patient's life.                           - CALL DR. Carlean Purl IN 1 MONTH TO TELL HIM HOW                            SWALLOWING IS - IF STILL HAVING PROBLEMS WOULD  REPEAT A DILATION.                           - Continue present medications. Gatha Mayer, MD 08/24/2016 12:15:55 PM This report has been signed electronically.

## 2016-08-25 ENCOUNTER — Encounter: Payer: Self-pay | Admitting: Internal Medicine

## 2016-08-28 ENCOUNTER — Telehealth: Payer: Self-pay | Admitting: *Deleted

## 2016-08-28 NOTE — Telephone Encounter (Signed)
  Follow up Call-  Call back number 08/24/2016  Post procedure Call Back phone  # [phone]  Permission to leave phone message Yes  Some recent data might be hidden     Patient questions:  Message left to call us if necessary.

## 2016-09-07 IMAGING — US US SOFT TISSUE HEAD/NECK
1 series · 14 of 25 positions shown · non-contrast
Comparison: None.

CLINICAL DATA: Thyromegaly.  Complains of dysphasia.

EXAM:
THYROID ULTRASOUND
TECHNIQUE: Ultrasound examination of the thyroid gland and adjacent soft
tissues was performed.

[Series 1: us soft tissue head/neck · 0.05mm/px · 14 of 45 slices shown]
[im 1/45]
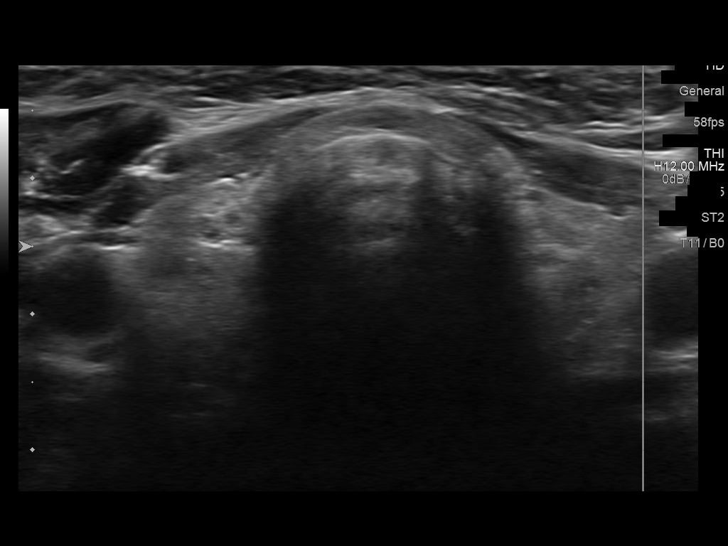
[im 4/45]
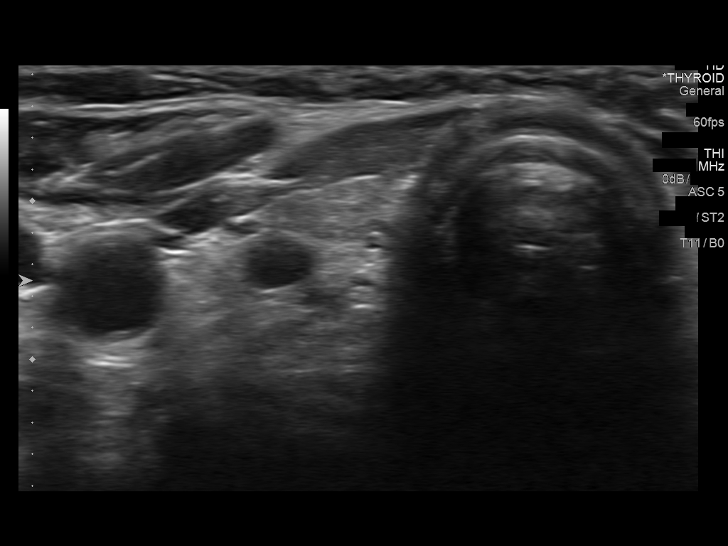
[im 8/45]
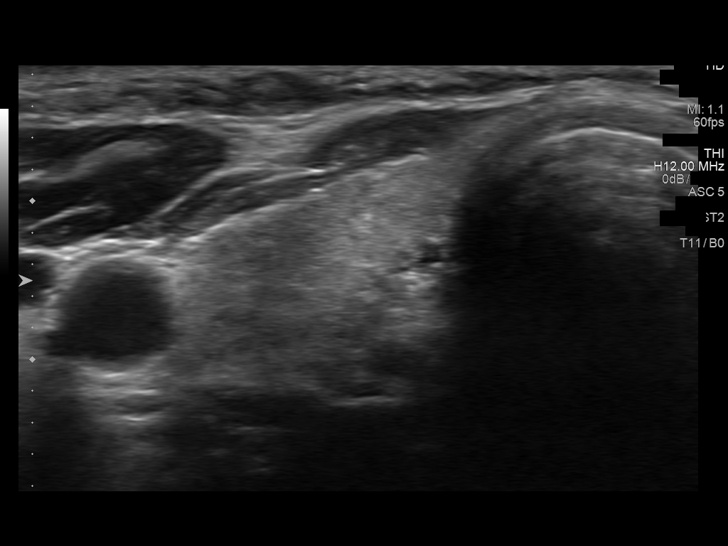
[im 12/45]
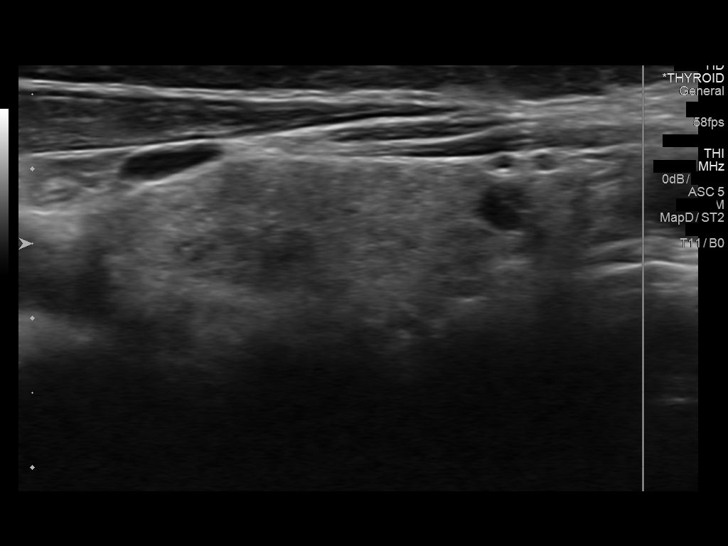
[im 15/45]
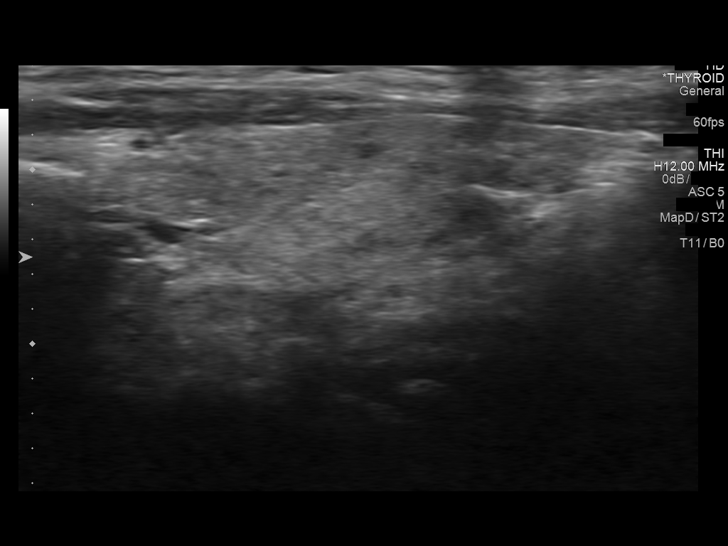
[im 17/45]
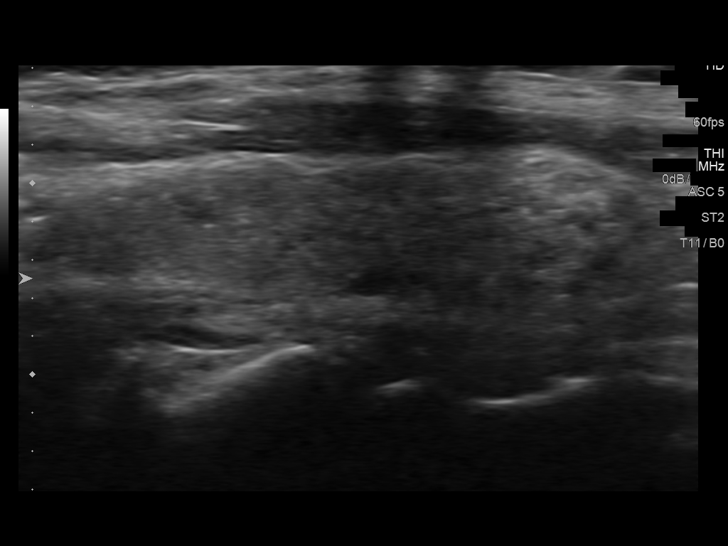
[im 21/45]
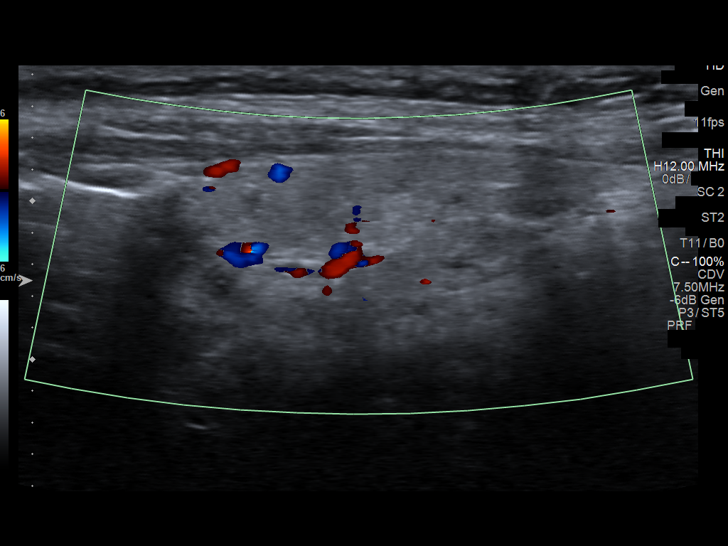
[im 24/45]
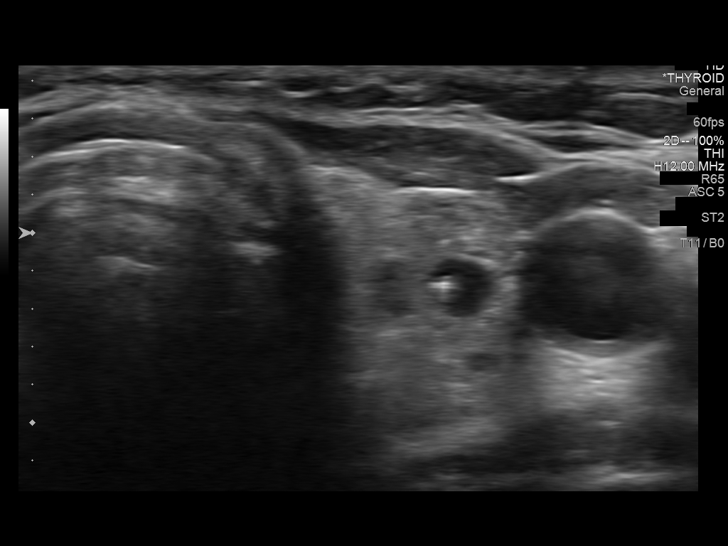
[im 28/45]
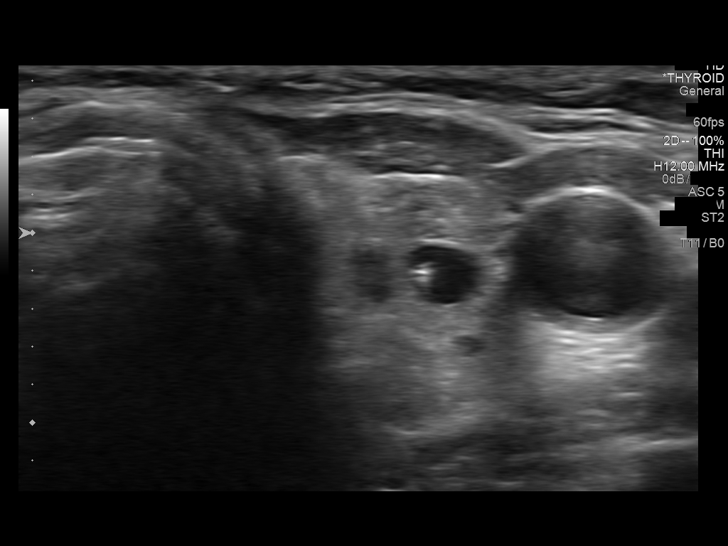
[im 30/45]
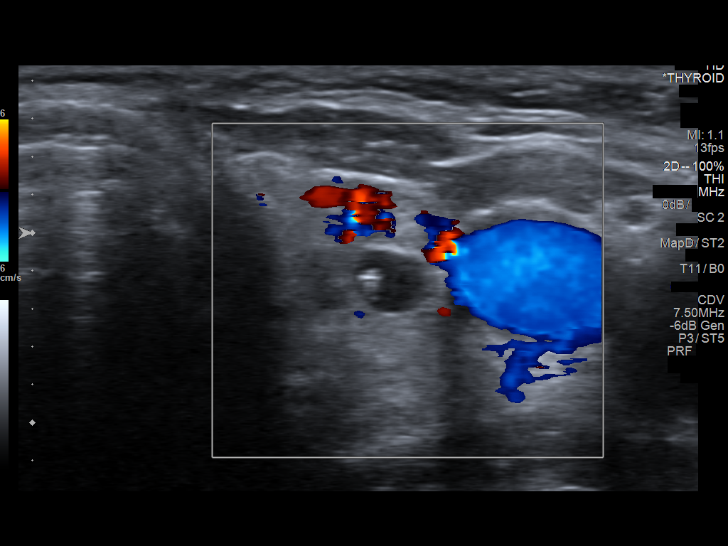
[im 34/45]
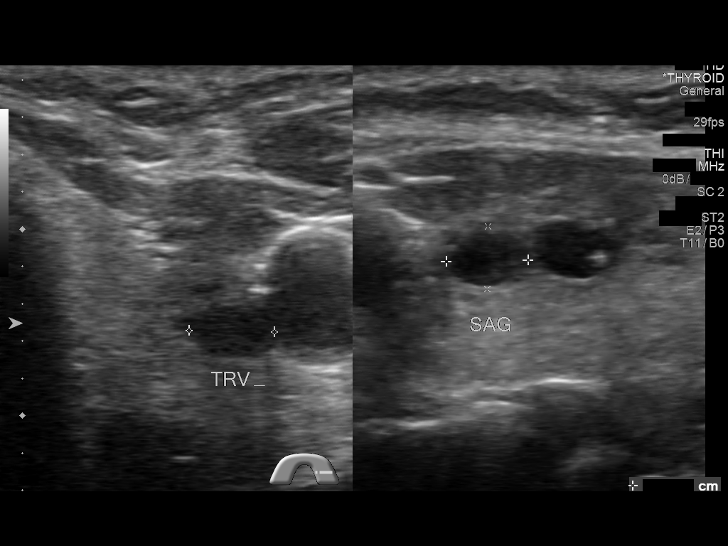
[im 37/45]
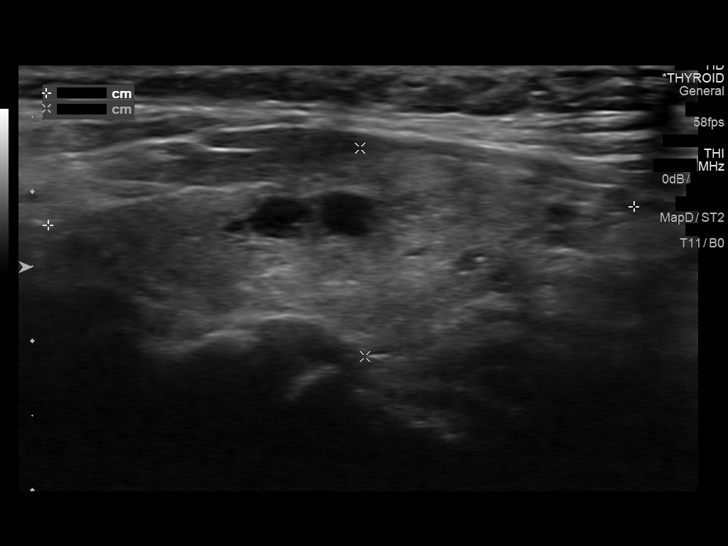
[im 41/45]
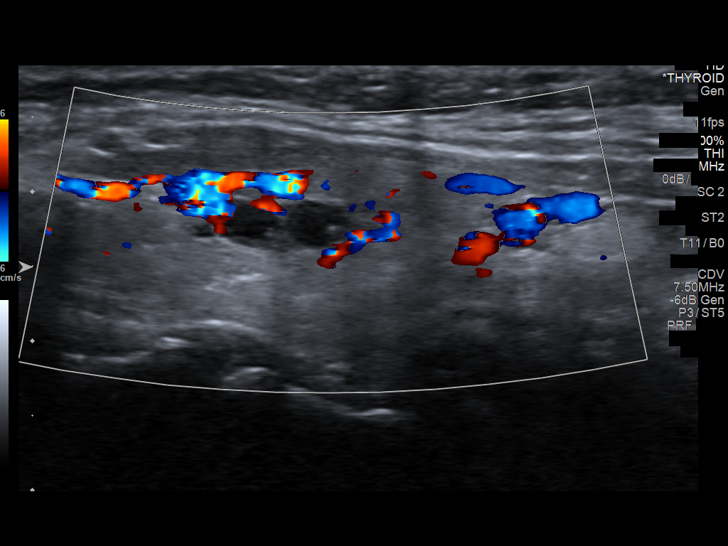
[im 45/45]
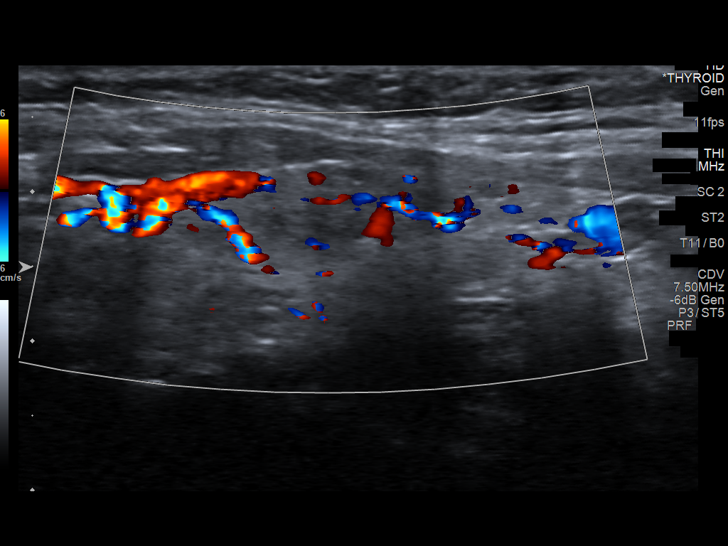

[14 of 25 positions shown; findings below may reference images not displayed]

FINDINGS: Right thyroid lobe

Measurements: 3.5 x 1.1 x 1.8 cm. Hypoechoic nodule in the inferior
right thyroid lobe measures 0.5 x 0.3 x 0.5 cm. Otherwise, the right
thyroid tissue is mildly heterogeneous.

Left thyroid lobe

Measurements: 3.9 x 1.4 x 1.1 cm. There are 2 hypoechoic
nodules/complex cysts in the left mid left thyroid lobe, each
measuring up to 0.5 cm.

Isthmus

Thickness: 0.1 cm.  No nodules visualized.

Lymphadenopathy

None visualized.
IMPRESSION: Small bilateral thyroid nodules. Findings do not meet current SRU
consensus criteria for biopsy. Follow-up by clinical exam is
recommended. If patient has known risk factors for thyroid
carcinoma, consider follow-up ultrasound in 12 months. If patient is
clinically hyperthyroid, consider nuclear medicine thyroid uptake
and scan.Reference: Management of Thyroid Nodules Detected at US:
Society of Radiologists in Ultrasound Consensus Conference

## 2017-01-09 ENCOUNTER — Other Ambulatory Visit: Payer: Self-pay | Admitting: Family Medicine

## 2017-02-04 ENCOUNTER — Telehealth: Payer: Self-pay | Admitting: Family Medicine

## 2017-02-04 ENCOUNTER — Encounter: Payer: Self-pay | Admitting: Internal Medicine

## 2017-02-04 NOTE — Telephone Encounter (Signed)
I called pt and left a vm to inform her she is due for a F/u appt with Dr.Metheney for her GERD and chronic issues and to call our office to schedule

## 2017-02-04 NOTE — Telephone Encounter (Signed)
Patient stopped taking omeprazole due to diarrhea and headaches. Patient advised to schedule a follow up for hyperlipid and needs labs.

## 2017-02-04 NOTE — Addendum Note (Signed)
Addended by: Narda Rutherford on: 02/04/2017 03:22 PM   Modules accepted: Orders

## 2017-03-18 ENCOUNTER — Other Ambulatory Visit: Payer: Self-pay | Admitting: Family Medicine

## 2017-04-18 ENCOUNTER — Other Ambulatory Visit: Payer: Self-pay | Admitting: Family Medicine

## 2017-04-22 ENCOUNTER — Ambulatory Visit (INDEPENDENT_AMBULATORY_CARE_PROVIDER_SITE_OTHER): Payer: Medicare HMO | Admitting: Family Medicine

## 2017-04-22 ENCOUNTER — Encounter: Payer: Self-pay | Admitting: Family Medicine

## 2017-04-22 VITALS — BP 108/52 | HR 60 | Resp 16 | Ht 62.0 in | Wt 133.0 lb

## 2017-04-22 DIAGNOSIS — Z85828 Personal history of other malignant neoplasm of skin: Secondary | ICD-10-CM | POA: Diagnosis not present

## 2017-04-22 DIAGNOSIS — Z0000 Encounter for general adult medical examination without abnormal findings: Secondary | ICD-10-CM | POA: Diagnosis not present

## 2017-04-22 DIAGNOSIS — K2100 Gastro-esophageal reflux disease with esophagitis, without bleeding: Secondary | ICD-10-CM

## 2017-04-22 DIAGNOSIS — E7800 Pure hypercholesterolemia, unspecified: Secondary | ICD-10-CM | POA: Diagnosis not present

## 2017-04-22 NOTE — Patient Instructions (Addendum)
Health Maintenance  Topic Date Due  . TETANUS/TDAP  12/30/2016  . INFLUENZA VACCINE  07/24/2017  . DEXA SCAN  Completed  . PNA vac Low Risk Adult  Completed   Goal to lose 5 lbs and get back down to prior weight.    Can try Zantac 75mg  twice a day if the probiotic is not working.    Try to get [place] a copy of your healthcare power of attorney.
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
## 2017-04-23 ENCOUNTER — Other Ambulatory Visit: Payer: Self-pay | Admitting: *Deleted

## 2017-04-23 LAB — LIPID PANEL W/REFLEX DIRECT LDL
Cholesterol: 167 mg/dL (ref <200)
HDL: 68 mg/dL (ref >50)
LDL-Cholesterol: 79 mg/dL
Non-HDL Cholesterol (Calc): 99 mg/dL (ref <130)
Total CHOL/HDL Ratio: 2.5 Ratio (ref <5.0)
Triglycerides: 121 mg/dL (ref <150)

## 2017-04-23 LAB — CMP 10231
AG Ratio: 2 Ratio (ref 1.0–2.5)
ALBUMIN: 4.5 g/dL (ref 3.6–5.1)
ALT: 16 U/L (ref 6–29)
AST: 18 U/L (ref 10–35)
Alkaline Phosphatase: 77 U/L (ref 33–130)
BUN/Creatinine Ratio: 14.3 Ratio (ref 6–22)
BUN: 9 mg/dL (ref 7–25)
CHLORIDE: 102 mmol/L (ref 98–110)
CO2: 22 mmol/L (ref 20–31)
Calcium: 9.4 mg/dL (ref 8.6–10.4)
Creat: 0.63 mg/dL (ref 0.60–0.88)
GFR, EST NON AFRICAN AMERICAN: 84 mL/min (ref >=60)
GFR, Est African American: >89 mL/min (ref >=60)
GLUCOSE: 92 mg/dL (ref 65–99)
Globulin: 2.3 g/dL (ref 1.9–3.7)
POTASSIUM: 4.2 mmol/L (ref 3.5–5.3)
SODIUM: 136 mmol/L (ref 135–146)
TOTAL PROTEIN: 6.8 g/dL (ref 6.1–8.1)
Total Bilirubin: 0.7 mg/dL (ref 0.2–1.2)

## 2017-04-23 MED ORDER — SIMVASTATIN 40 MG PO TABS
20.0000 mg | ORAL_TABLET | Freq: Every day | ORAL | 3 refills | Status: DC
Start: 2017-04-23 — End: 2018-02-11

## 2017-04-23 NOTE — Progress Notes (Signed)
All labs are normal. 

## 2017-05-21 ENCOUNTER — Telehealth: Payer: Self-pay | Admitting: *Deleted

## 2017-05-21 MED ORDER — AMBULATORY NON FORMULARY MEDICATION: 0 refills | Status: DC

## 2017-05-21 NOTE — Telephone Encounter (Signed)
Pt will be traveling to [place] she will need typhoid and Hep A vaccines to take to the pharmacy to be given. Prescriptions printed and placed up front for p/u.[name]

## 2017-05-27 DIAGNOSIS — L821 Other seborrheic keratosis: Secondary | ICD-10-CM | POA: Diagnosis not present

## 2017-05-27 DIAGNOSIS — L820 Inflamed seborrheic keratosis: Secondary | ICD-10-CM | POA: Diagnosis not present

## 2017-05-27 DIAGNOSIS — Z85828 Personal history of other malignant neoplasm of skin: Secondary | ICD-10-CM | POA: Diagnosis not present

## 2017-06-06 ENCOUNTER — Telehealth: Payer: Self-pay | Admitting: *Deleted

## 2017-06-06 MED ORDER — TYPHOID VACCINE PO CPDR: 1.0000 | DELAYED_RELEASE_CAPSULE | ORAL | 0 refills | Status: DC

## 2017-06-06 NOTE — Telephone Encounter (Signed)
Ok, rx sent to [facility].

## 2017-06-06 NOTE — Telephone Encounter (Signed)
Patient called and states she cannot get the prescription for the vaccine typhoid filled at the pharmacy. She needs a prescription sent for oral typhoid to [facility] [place] on [address] and Animal nutritionist.

## 2017-06-07 NOTE — Telephone Encounter (Signed)
Left message on vm

## 2018-02-11 ENCOUNTER — Other Ambulatory Visit: Payer: Self-pay | Admitting: Family Medicine

## 2018-02-14 ENCOUNTER — Ambulatory Visit (INDEPENDENT_AMBULATORY_CARE_PROVIDER_SITE_OTHER): Payer: Medicare HMO | Admitting: Family Medicine

## 2018-02-14 ENCOUNTER — Encounter: Payer: Self-pay | Admitting: Family Medicine

## 2018-02-14 VITALS — BP 119/55 | HR 96 | Temp 99.3°F | Ht 62.0 in | Wt 134.0 lb

## 2018-02-14 DIAGNOSIS — J019 Acute sinusitis, unspecified: Secondary | ICD-10-CM

## 2018-02-14 MED ORDER — AZITHROMYCIN 250 MG PO TABS: ORAL_TABLET | ORAL | 0 refills | Status: AC

## 2018-02-14 NOTE — Progress Notes (Signed)
   Subjective:    Patient ID: Carmen Casey, female    DOB: 03/09/1933, 84 y.o.   MRN: [id number]  HPI 84 yo female c/o of nasal congestion for about 1 week.  She is a lot of pressure behind her eyes.  A little bit of congestion and cough in her chest.  She feels a lot of pressure in both ears.  She has been traveling everyone else that she traveled with has been sick as well..  Denies any fever, chills or sweats.  Using Aleve and Nyquail at bedtime.  She is flying out on Monday.   Review of Systems     Objective:   Physical Exam  Constitutional: She is oriented to person, place, and time. She appears well-developed and well-nourished.  HENT:  Head: Normocephalic and atraumatic.  Right Ear: External ear normal.  Left Ear: External ear normal.  Nose: Nose normal.  Mouth/Throat: Oropharynx is clear and moist.  TMs and canals are clear.   Eyes: Conjunctivae and EOM are normal. Pupils are equal, round, and reactive to light.  Neck: Neck supple. No thyromegaly present.  Cardiovascular: Normal rate, regular rhythm and normal heart sounds.  Pulmonary/Chest: Effort normal and breath sounds normal. She has no wheezes.  Lymphadenopathy:    She has no cervical adenopathy.  Neurological: She is alert and oriented to person, place, and time.  Skin: Skin is warm and dry.  Psychiatric: She has a normal mood and affect.          Assessment & Plan:  Acute sinusitis -she is been symptomatic for at least a week and really is not feeling any better.  We will go ahead and treat with azithromycin.  If not better in 1 week then please give [place] a call back.  Recommend nasal saline rinse and salt water gargles.  He is flying out Monday so did encourage her to make sure that she swallows frequently or either choose a piece of gum to help reduce pressure in her ears.

## 2018-02-14 NOTE — Patient Instructions (Addendum)
Sinus Rinse What is a sinus rinse? A sinus rinse is a home treatment. It rinses your sinuses with a mixture of salt and water (saline solution). Sinuses are air-filled spaces in your skull behind the bones of your face and forehead. They open into your nasal cavity. To do a sinus rinse, you will need:  Saline solution.  Neti pot or spray bottle. This releases the saline solution into your nose and through your sinuses. You can buy neti pots and spray bottles at: ? Your local pharmacy. ? A health food store. ? Online.  When should I do a sinus rinse? A sinus rinse can help to clear your nasal cavity. It can clear:  Mucus.  Dirt.  Dust.  Pollen.  You may do a sinus rinse when you have:  A cold.  A virus.  Allergies.  A sinus infection.  A stuffy nose.  If you are considering a sinus rinse:  Ask your child's doctor before doing a sinus rinse on your child.  Do not do a sinus rinse if you have had: ? Ear or nasal surgery. ? An ear infection. ? Blocked ears.  How do I do a sinus rinse?  Wash your hands.  Disinfect your device using the directions that came with the device.  Dry your device.  Use the solution that comes with your device or one that is sold separately in stores. Follow the mixing directions on the package.  Fill your device with the amount of saline solution as stated in the device instructions.  Stand over a sink and tilt your head sideways over the sink.  Place the spout of the device in your upper nostril (the one closer to the ceiling).  Gently pour or squeeze the saline solution into the nasal cavity. The liquid should drain to the lower nostril if you are not too congested.  Gently blow your nose. Blowing too hard may cause ear pain.  Repeat in the other nostril.  Clean and rinse your device with clean water.  Air-dry your device. Are there risks of a sinus rinse? Sinus rinse is normally very safe and helpful. However, there are a  few risks, which include:  A burning feeling in the sinuses. This may happen if you do not make the saline solution as instructed. Make sure to follow all directions when making the saline solution.  Infection from unclean water. This is rare, but possible.  Nasal irritation.  This information is not intended to replace advice given to you by your health care provider. Make sure you discuss any questions you have with your health care provider. Document Released: 07/07/2014 Document Revised: 11/06/2016 Document Reviewed: 04/27/2014 Elsevier Interactive Patient Education  2017 Elsevier Inc.  

## 2018-09-01 ENCOUNTER — Other Ambulatory Visit: Payer: Self-pay | Admitting: Family Medicine

## 2018-10-08 ENCOUNTER — Other Ambulatory Visit: Payer: Self-pay | Admitting: Family Medicine

## 2018-12-02 NOTE — Progress Notes (Signed)
Subjective:    CC: here for 6 mo f/u.    HPI:  F/U hyperlipidemia -currently only on a statin and tolerating well without any side effects or myalgias.  F/U GERD -not currently on any medications and not having any persistent symptoms.  She has had some pain in her right foot mostly over the second toe but now hurting a little bit over the second third metatarsals.  No trauma or injury.  She says the longer she walks on it, she can usually just ignore it.  Does not keep her from doing anything.  She does try to wear good supportive shoe wear.  Not had any swelling or redness.  She is also been having some intermittent headaches.  She says she notices it more if she has been looking down at her tablet to watch movies.  She will notice that afterwards she will have some frontal headaches.  She is been trying to watch her movies on a bigger screen so she is keeping her head more upright and that seems to have made a difference.   Past medical history, Surgical history, Family history not pertinant except as noted below, Social history, Allergies, and medications have been entered into the medical record, reviewed, and corrections made.   Review of Systems: No fevers, chills, night sweats, weight loss, chest pain, or shortness of breath.   Objective:    General: Well Developed, well nourished, and in no acute distress.  Neuro: Alert and oriented x3, extra-ocular muscles intact, sensation grossly intact.  HEENT: Normocephalic, atraumatic, no significant cervical lymphadenopathy.  No thyromegaly. Skin: Warm and dry, no rashes. Cardiac: Regular rate and rhythm, no murmurs rubs or gallops, no lower extremity edema.  Respiratory: Clear to auscultation bilaterally. Not using accessory muscles, speaking in full sentences. MSK: The skin on her right foot overall looks good no significant erythema or induration or swelling.  She has a very large bunion.  Nontender over the metatarsal heads or toes.   She actually has very high proximal arches.  Impression and Recommendations:    GERD -stable.  Not currently on medication.  Hyperlipidemia -tolerating statin well.  We will make sure that she has plenty of refills due to recheck labs today.  Right foot pain -recommend a trial of metatarsal pads also gave her some pads for scaphoid support for arch support she actually has very high arches on her feet.  If these are not helpful we can always get her in with podiatry if needed.  Given Tdap.

## 2018-12-03 ENCOUNTER — Ambulatory Visit (INDEPENDENT_AMBULATORY_CARE_PROVIDER_SITE_OTHER): Payer: Medicare HMO | Admitting: Family Medicine

## 2018-12-03 ENCOUNTER — Encounter: Payer: Self-pay | Admitting: Family Medicine

## 2018-12-03 VITALS — BP 130/58 | HR 58 | Ht 61.81 in | Wt 136.0 lb

## 2018-12-03 DIAGNOSIS — M79671 Pain in right foot: Secondary | ICD-10-CM | POA: Diagnosis not present

## 2018-12-03 DIAGNOSIS — E7800 Pure hypercholesterolemia, unspecified: Secondary | ICD-10-CM

## 2018-12-03 DIAGNOSIS — R51 Headache: Secondary | ICD-10-CM

## 2018-12-03 DIAGNOSIS — R519 Headache, unspecified: Secondary | ICD-10-CM

## 2018-12-03 MED ORDER — SIMVASTATIN 40 MG PO TABS: 40.0000 mg | ORAL_TABLET | Freq: Every day | ORAL | 3 refills | Status: AC

## 2018-12-03 MED ORDER — AMBULATORY NON FORMULARY MEDICATION: 0 refills | Status: DC

## 2018-12-04 LAB — CBC
HCT: 43.4 % (ref 35.0–45.0)
Hemoglobin: 15 g/dL (ref 11.7–15.5)
MCH: 31.3 pg (ref 27.0–33.0)
MCHC: 34.6 g/dL (ref 32.0–36.0)
MCV: 90.4 fL (ref 80.0–100.0)
MPV: 11.5 fL (ref 7.5–12.5)
PLATELETS: 188 10*3/uL (ref 140–400)
RBC: 4.8 10*6/uL (ref 3.80–5.10)
RDW: 11.8 % (ref 11.0–15.0)
WBC: 5.7 10*3/uL (ref 3.8–10.8)

## 2018-12-04 LAB — COMPLETE METABOLIC PANEL WITH GFR
AG RATIO: 2 (calc) (ref 1.0–2.5)
ALBUMIN MSPROF: 4.4 g/dL (ref 3.6–5.1)
ALT: 15 U/L (ref 6–29)
AST: 17 U/L (ref 10–35)
Alkaline phosphatase (APISO): 76 U/L (ref 33–130)
BILIRUBIN TOTAL: 0.5 mg/dL (ref 0.2–1.2)
BUN: 11 mg/dL (ref 7–25)
CHLORIDE: 102 mmol/L (ref 98–110)
CO2: 27 mmol/L (ref 20–32)
Calcium: 8.9 mg/dL (ref 8.6–10.4)
Creat: 0.64 mg/dL (ref 0.60–0.88)
GFR, EST NON AFRICAN AMERICAN: 83 mL/min/{1.73_m2} (ref >=60)
GFR, Est African American: 96 mL/min/{1.73_m2} (ref >=60)
GLOBULIN: 2.2 g/dL (ref 1.9–3.7)
GLUCOSE: 95 mg/dL (ref 65–99)
Potassium: 4.3 mmol/L (ref 3.5–5.3)
SODIUM: 136 mmol/L (ref 135–146)
Total Protein: 6.6 g/dL (ref 6.1–8.1)

## 2018-12-04 LAB — LIPID PANEL
CHOL/HDL RATIO: 3 (calc) (ref <5.0)
Cholesterol: 178 mg/dL (ref <200)
HDL: 60 mg/dL (ref >50)
LDL CHOLESTEROL (CALC): 99 mg/dL
Non-HDL Cholesterol (Calc): 118 mg/dL (calc) (ref <130)
TRIGLYCERIDES: 99 mg/dL (ref <150)

## 2018-12-04 NOTE — Progress Notes (Signed)
All labs are normal. 

## 2019-01-13 ENCOUNTER — Telehealth: Payer: Self-pay

## 2019-01-13 MED ORDER — AMBULATORY NON FORMULARY MEDICATION: 0 refills | Status: AC

## 2019-01-13 NOTE — Telephone Encounter (Signed)
Carmen Casey would like [place] to fax an [facility] and Typhoid vaccine.   Fax: [phone]

## 2019-01-13 NOTE — Telephone Encounter (Signed)
[facility]ed to [facility] Maryruth Eve, Lahoma Crocker, CMA

## 2019-01-15 DIAGNOSIS — Z23 Encounter for immunization: Secondary | ICD-10-CM | POA: Diagnosis not present

## 2019-02-11 NOTE — Progress Notes (Deleted)
Subjective:   Carmen Casey is a 85 y.o. female who presents for Medicare Annual (Subsequent) preventive examination.  Review of Systems:  No ROS.  Medicare Wellness Visit. Additional risk factors are reflected in the social history.    Sleep patterns:   Home Safety/Smoke Alarms: Feels safe in home. Smoke alarms in place.  Living environment;  Seat Belt Safety/Bike Helmet: Wears seat belt.   Female:   Pap- aged out      [place]-  Aged out unless necessary     Dexa scan-        CCS- aged out      Objective:     Vitals: There were no vitals taken for this visit.  There is no height or weight on file to calculate BMI.  Advanced Directives 04/22/2017 08/24/2016 05/14/2016 05/10/2015 01/17/2015  Does Patient Have a Medical Advance Directive? Yes Yes Yes Yes Yes  Type of [employer] [place];Living will [place];Living will Living will;Healthcare Power of Attorney  Does patient want to make changes to medical advance directive? No - Patient declined - No - Patient declined No - Patient declined No - Patient declined  Copy of Healthcare Power of Attorney in Chart? - No - copy requested No - copy requested No - copy requested No - copy requested    Tobacco Social History   Tobacco Use  Smoking Status Never Smoker  Smokeless Tobacco Never Used     Counseling given: Not Answered   Clinical Intake:                       Past Medical History:  Diagnosis Date  . Basal cell carcinoma of forehead   . Duodenal ulcer 12/2003   in [place]  . Dyslipidemia   . External hemorrhoid   . Eye injury    Right eye as a child.   . Internal hemorrhoid   . Osteoarthritis   . Polymyositis ([place])   . Shingles 2005  . Vertigo    Past Surgical History:  Procedure Laterality Date  . CATARACT EXTRACTION, BILATERAL  2000  . COLONOSCOPY    . ESOPHAGOGASTRODUODENOSCOPY    . TONSILLECTOMY     Family History   Problem Relation Age of Onset  . Hyperlipidemia Mother   . Hypertension Mother   . Stroke Mother 12  . Alcohol abuse Father   . Hyperlipidemia Sister    Social History   Socioeconomic History  . Marital status: Single    Spouse name: Not on file  . Number of children: Not on file  . Years of education: Not on file  . Highest education level: Not on file  Occupational History  . Occupation: Personal assistant.     Employer: [employer]  Social Needs  . Financial resource strain: Not on file  . Food insecurity:    Worry: Not on file    Inability: Not on file  . Transportation needs:    Medical: Not on file    Non-medical: Not on file  Tobacco Use  . Smoking status: Never Smoker  . Smokeless tobacco: Never Used  Substance and Sexual Activity  . Alcohol use: Yes    Alcohol/week: 7.0 standard drinks    Types: 7 Glasses of wine per week  . Drug use: No  . Sexual activity: Not on file  Lifestyle  . Physical activity:    Days per week: Not on file  Minutes per session: Not on file  . Stress: Not on file  Relationships  . Social connections:    Talks on phone: Not on file    Gets together: Not on file    Attends religious service: Not on file    Active member of club or organization: Not on file    Attends meetings of clubs or organizations: Not on file    Relationship status: Not on file  Other Topics Concern  . Not on file  Social History Narrative   [employer], Ph.D.  Mother lives in a retirement home here in [place].  Travels around the world.  Single and never had a sexual partner. Quit smoking 1962, no drugs, no EtOH, 1 caffeinated drink per day, Regular exercise, including walking and golf.  Water exercising.         Outpatient Encounter Medications as of 02/16/2019  Medication Sig  . AMBULATORY NON FORMULARY MEDICATION Typhoid vaccine  . AMBULATORY NON FORMULARY MEDICATION Medication Name: Tdap x 1 IM  . simvastatin (ZOCOR) 40 MG tablet  Take 1 tablet (40 mg total) by mouth daily at 6 PM.   No facility-administered encounter medications on file as of 02/16/2019.     Activities of Daily Living No flowsheet data found.  Patient Care Team: Hali Marry, MD as PCP - General Juliann Mule, MD as Referring Physician (Ophthalmology)    Assessment:   This is a routine wellness examination for [facility].Physical assessment deferred to PCP.  Exercise Activities and Dietary recommendations   Diet Breakfast: Lunch:  Dinner:       Goals   None     Fall Risk Fall Risk  05/14/2016 05/10/2015 04/01/2014 03/19/2013  Falls in the past year? No No No Yes  Number falls in past yr: - - - 1  Injury with Fall? - - - No   Is the patient's home free of loose throw rugs in walkways, pet beds, electrical cords, etc?   {Blank single:19197::"yes","no"}      Grab bars in the bathroom? {Blank single:19197::"yes","no"}      Handrails on the stairs?   {Blank single:19197::"yes","no"}      Adequate lighting?   {Blank single:19197::"yes","no"}   Depression Screen PHQ 2/9 Scores 04/22/2017 05/14/2016 05/10/2015 04/01/2014  PHQ - 2 Score 0 0 0 0     Cognitive Function     6CIT Screen 04/22/2017  What Year? 0 points  What month? 0 points  What time? 0 points  Count back from 20 0 points  Months in reverse 0 points  Repeat phrase 2 points  Total Score 2    Immunization History  Administered Date(s) Administered  . Influenza Split 10/24/2012  . Influenza Whole 10/24/2005, 12/30/2006, 10/24/2009  . Influenza, High Dose Seasonal PF 09/12/2018  . Influenza,inj,Quad PF,6+ Mos 09/23/2014, 08/24/2017  . Influenza-Unspecified 09/23/2013  . Pneumococcal Conjugate-13 02/04/2015  . Pneumococcal Polysaccharide-23 12/30/2006  . Td 12/30/2006  . Typhoid Inactivated 06/23/2018  . Zoster 10/24/2012    Screening Tests Health Maintenance  Topic Date Due  . TETANUS/TDAP  12/30/2016  . INFLUENZA VACCINE  Completed  . DEXA SCAN   Completed  . PNA vac Low Risk Adult  Completed        Plan:   ***   I have personally reviewed and noted the following in the patient's chart:   . Medical and social history . Use of alcohol, tobacco or illicit drugs  . Current medications and supplements . Functional ability and status .  Nutritional status . Physical activity . Advanced directives . List of other physicians . Hospitalizations, surgeries, and ER visits in previous 12 months . Vitals . Screenings to include cognitive, depression, and falls . Referrals and appointments  In addition, I have reviewed and discussed with patient certain preventive protocols, quality metrics, and best practice recommendations. A written personalized care plan for preventive services as well as general preventive health recommendations were provided to patient.     Joanne Chars, LPN  9/32/6712

## 2019-02-16 ENCOUNTER — Ambulatory Visit: Payer: Medicare HMO

## 2019-02-19 NOTE — Progress Notes (Signed)
Subjective:   Carmen Casey is a 85 y.o. female who presents for Medicare Annual (Subsequent) preventive examination.  Review of Systems:  No ROS.  Medicare Wellness Visit. Additional risk factors are reflected in the social history.  Cardiac Risk Factors include: advanced age (>59men, >32 women) Sleep patterns: Getting average of 7.5 - 8 hours of sleep a night. Wakes up occasionally to void. Feels rested upon wakening. Home Safety/Smoke Alarms: Feels safe in home. Smoke alarms in place.  Living environment; Lives alone in home. No stairs in the home. SHower is a walk in and grab bars are in place. Seat Belt Safety/Bike Helmet: Wears seat belt.   Female:   Pap- aged out      [place]- aged out      Dexa scan-  Aged out      CCS- aged out     Objective:     Vitals: BP 108/60 (BP Location: Left Arm, Patient Position: Sitting, Cuff Size: Normal)   Pulse 69   Ht 5\' 2"  (1.575 m)   Wt 137 lb (62.1 kg)   BMI 25.06 kg/m   Body mass index is 25.06 kg/m.  Advanced Directives 02/25/2019 04/22/2017 08/24/2016 05/14/2016 05/10/2015 01/17/2015  Does Patient Have a Medical Advance Directive? Yes Yes Yes Yes Yes Yes  Type of Advance Directive Living will;Healthcare Power of [place];Living will [place];Living will Living will;Healthcare Power of Attorney  Does patient want to make changes to medical advance directive? No - Patient declined No - Patient declined - No - Patient declined No - Patient declined No - Patient declined  Copy of [place] in Chart? No - copy requested - No - copy requested No - copy requested No - copy requested No - copy requested    Tobacco Social History   Tobacco Use  Smoking Status Never Smoker  Smokeless Tobacco Never Used     Counseling given: No   Clinical Intake:  Pre-visit preparation completed: Yes  Pain : No/denies pain     Nutritional Risks:  None Diabetes: No  How often do you need to have someone help you when you read instructions, pamphlets, or other written materials from your doctor or pharmacy?: 1 - Never What is the last grade level you completed in school?: PHD in linguistics  Interpreter Needed?: No  Information entered by :: Orlie Dakin, LPN  Past Medical History:  Diagnosis Date  . Basal cell carcinoma of forehead   . Duodenal ulcer 12/2003   in [place]  . Dyslipidemia   . External hemorrhoid   . Eye injury    Right eye as a child.   [name] GERD (gastroesophageal reflux disease)   . Internal hemorrhoid   . Osteoarthritis   . Polymyositis ([place])   . Shingles 2005  . Vertigo    Past Surgical History:  Procedure Laterality Date  . CATARACT EXTRACTION, BILATERAL  2000  . COLONOSCOPY    . ESOPHAGOGASTRODUODENOSCOPY    . TONSILLECTOMY     Family History  Problem Relation Age of Onset  . Hyperlipidemia Mother   . Hypertension Mother   . Stroke Mother 78  . Alcohol abuse Father   . Hyperlipidemia Sister    Social History   Socioeconomic History  . Marital status: Single    Spouse name: Not on file  . Number of children: Not on file  . Years of education: PHD  . Highest education  level: Master's degree (e.g., MA, MS, MEng, MEd, MSW, MBA)  Occupational History  . Occupation: Personal assistant.     Employer: [employer]  Social Needs  . Financial resource strain: Not hard at all  . Food insecurity:    Worry: Never true    Inability: Never true  . Transportation needs:    Medical: No    Non-medical: No  Tobacco Use  . Smoking status: Never Smoker  . Smokeless tobacco: Never Used  Substance and Sexual Activity  . Alcohol use: Yes    Alcohol/week: 2.0 standard drinks    Types: 2 Glasses of wine per week  . Drug use: No  . Sexual activity: Not Currently  Lifestyle  . Physical activity:    Days per week: 7 days    Minutes per session: 120 min  . Stress: Not at all  Relationships  .  Social connections:    Talks on phone: More than three times a week    Gets together: More than three times a week    Attends religious service: More than 4 times per year    Active member of club or organization: Yes    Attends meetings of clubs or organizations: More than 4 times per year    Relationship status: Never married  Other Topics Concern  . Not on file  Social History Narrative   [employer], Ph.D.  Mother lives in a retirement home here in [place].  Travels around the world.  Single and never had a sexual partner. Quit smoking 1962, no drugs, no EtOH, 1 caffeinated drink per day, Regular exercise, including walking and golf.  Water exercising.         Outpatient Encounter Medications as of 02/25/2019  Medication Sig  . AMBULATORY NON FORMULARY MEDICATION Medication Name: Tdap x 1 IM  . simvastatin (ZOCOR) 40 MG tablet Take 1 tablet (40 mg total) by mouth daily at 6 PM.  . AMBULATORY NON FORMULARY MEDICATION Typhoid vaccine (Patient not taking: Reported on 02/25/2019)   No facility-administered encounter medications on file as of 02/25/2019.     Activities of Daily Living In your present state of health, do you have any difficulty performing the following activities: 02/25/2019  Hearing? Y  Comment wears hearing aides all the time  Vision? N  Difficulty concentrating or making decisions? N  Walking or climbing stairs? N  Dressing or bathing? N  Doing errands, shopping? N  Preparing Food and eating ? N  Using the Toilet? N  In the past six months, have you accidently leaked urine? N  Do you have problems with loss of bowel control? N  Managing your Medications? N  Managing your Finances? N  Housekeeping or managing your Housekeeping? N  Some recent data might be hidden    Patient Care Team: Hali Marry, MD as PCP - General Juliann Mule, MD as Referring Physician (Ophthalmology)    Assessment:   This is a routine wellness examination for  [facility].Physical assessment deferred to PCP.   Exercise Activities and Dietary recommendations Current Exercise Habits: Structured exercise class, Type of exercise: strength training/weights;treadmill;stretching;Other - see comments(swimming), Time (Minutes): > 60, Frequency (Times/Week): 7, Weekly Exercise (Minutes/Week): 0, Intensity: Moderate, Exercise limited by: None identified Diet Eats a very healthy diet. Follows mediterranean diet. Breakfast: apple with oats, yogurt and walnuts. Lunch: cheese and crackers, black eyed peas, soup Dinner:   Meat and vegetables   Goals    . Weight (lb) < 200 lb (90.7  kg)     Patient would like to loose 10 lbs.       Fall Risk Fall Risk  02/25/2019 05/14/2016 05/10/2015 04/01/2014 03/19/2013  Falls in the past year? 0 No No No Yes  Number falls in past yr: - - - - 1  Injury with Fall? - - - - No  Follow up Falls prevention discussed - - - -   Is the patient's home free of loose throw rugs in walkways, pet beds, electrical cords, etc?   yes      Grab bars in the bathroom? yes      Handrails on the stairs?   no      Adequate lighting?   yes   Depression Screen PHQ 2/9 Scores 02/25/2019 04/22/2017 05/14/2016 05/10/2015  PHQ - 2 Score 0 0 0 0     Cognitive Function     6CIT Screen 02/25/2019 04/22/2017  What Year? 0 points 0 points  What month? 0 points 0 points  What time? 0 points 0 points  Count back from 20 0 points 0 points  Months in reverse 0 points 0 points  Repeat phrase 0 points 2 points  Total Score 0 2    Immunization History  Administered Date(s) Administered  . Influenza Split 10/24/2012  . Influenza Whole 10/24/2005, 12/30/2006, 10/24/2009  . Influenza, High Dose Seasonal PF 09/12/2018  . Influenza,inj,Quad PF,6+ Mos 09/23/2014, 08/24/2017  . Influenza-Unspecified 09/23/2013  . Pneumococcal Conjugate-13 02/04/2015  . Pneumococcal Polysaccharide-23 12/30/2006  . Td 12/30/2006  . Typhoid Inactivated 06/23/2018  . Zoster  10/24/2012    Screening Tests Health Maintenance  Topic Date Due  . TETANUS/TDAP  12/30/2016  . INFLUENZA VACCINE  Completed  . DEXA SCAN  Completed  . PNA vac Low Risk Adult  Completed       Plan:      Ms. Koranda , Thank you for taking time to come for your Medicare Wellness Visit. I appreciate your ongoing commitment to your health goals. Please review the following plan we discussed and let me know if I can assist you in the future.  Please schedule your next medicare wellness visit with me in 1 yr. Bring a copy of your living will and/or healthcare power of attorney to your next office visit.  These are the goals we discussed: Goals    . Weight (lb) < 200 lb (90.7 kg)     Patient would like to loose 10 lbs.       This is a list of the screening recommended for you and due dates:  Health Maintenance  Topic Date Due  . Tetanus Vaccine  12/30/2016  . Flu Shot  Completed  . DEXA scan (bone density measurement)  Completed  . Pneumonia vaccines  Completed      I have personally reviewed and noted the following in the patient's chart:   . Medical and social history . Use of alcohol, tobacco or illicit drugs  . Current medications and supplements . Functional ability and status . Nutritional status . Physical activity . Advanced directives . List of other physicians . Hospitalizations, surgeries, and ER visits in previous 12 months . Vitals . Screenings to include cognitive, depression, and falls . Referrals and appointments  In addition, I have reviewed and discussed with patient certain preventive protocols, quality metrics, and best practice recommendations. A written personalized care plan for preventive services as well as general preventive health recommendations were provided to patient.     Joelene Millin  Brynda Peon, LPN  5/0/2774

## 2019-02-25 ENCOUNTER — Ambulatory Visit (INDEPENDENT_AMBULATORY_CARE_PROVIDER_SITE_OTHER): Payer: Medicare HMO | Admitting: *Deleted

## 2019-02-25 VITALS — BP 108/60 | HR 69 | Ht 62.0 in | Wt 137.0 lb

## 2019-02-25 DIAGNOSIS — Z0000 Encounter for general adult medical examination without abnormal findings: Secondary | ICD-10-CM

## 2019-02-25 NOTE — Patient Instructions (Signed)
Ms. Kief , Thank you for taking time to come for your Medicare Wellness Visit. I appreciate your ongoing commitment to your health goals. Please review the following plan we discussed and let me know if I can assist you in the future.  Please schedule your next medicare wellness visit with me in 1 yr. Bring a copy of your living will and/or healthcare power of attorney to your next office visit. These are the goals we discussed: Goals    . Weight (lb) < 200 lb (90.7 kg)     Patient would like to loose 10 lbs.    Your doing a great job with your health. Keep up the good work!!

## 2019-09-24 DIAGNOSIS — K219 Gastro-esophageal reflux disease without esophagitis: Secondary | ICD-10-CM | POA: Diagnosis not present

## 2019-09-24 DIAGNOSIS — M3320 Polymyositis, organ involvement unspecified: Secondary | ICD-10-CM | POA: Diagnosis not present

## 2019-09-24 DIAGNOSIS — M159 Polyosteoarthritis, unspecified: Secondary | ICD-10-CM | POA: Diagnosis not present

## 2019-09-24 DIAGNOSIS — Z780 Asymptomatic menopausal state: Secondary | ICD-10-CM | POA: Diagnosis not present

## 2019-09-24 DIAGNOSIS — Z85828 Personal history of other malignant neoplasm of skin: Secondary | ICD-10-CM | POA: Diagnosis not present

## 2019-09-24 DIAGNOSIS — E785 Hyperlipidemia, unspecified: Secondary | ICD-10-CM | POA: Diagnosis not present

## 2019-12-28 DIAGNOSIS — M3320 Polymyositis, organ involvement unspecified: Secondary | ICD-10-CM | POA: Diagnosis not present

## 2019-12-28 DIAGNOSIS — Z0000 Encounter for general adult medical examination without abnormal findings: Secondary | ICD-10-CM | POA: Diagnosis not present

## 2020-02-09 DIAGNOSIS — L821 Other seborrheic keratosis: Secondary | ICD-10-CM | POA: Diagnosis not present

## 2020-02-09 DIAGNOSIS — Z85828 Personal history of other malignant neoplasm of skin: Secondary | ICD-10-CM | POA: Diagnosis not present

## 2020-02-16 NOTE — Progress Notes (Deleted)
Subjective:   Carmen Casey is a 86 y.o. female who presents for Medicare Annual (Subsequent) preventive examination.  Review of Systems:  No ROS.  Medicare Wellness Virtual Visit.  Visual/audio telehealth visit, UTA vital signs.   See social history for additional risk factors.      Sleep patterns:    Home Safety/Smoke Alarms: Feels safe in home. Smoke alarms in place.  Living environment;  Seat Belt Safety/Bike Helmet: Wears seat belt.   Female:   Pap- Aged out      Mammo- Aged out      Dexa scan-        CCS- Aged out     Objective:     Vitals: There were no vitals taken for this visit.  There is no height or weight on file to calculate BMI.  Advanced Directives 02/25/2019 04/22/2017 08/24/2016 05/14/2016 05/10/2015 01/17/2015  Does Patient Have a Medical Advance Directive? Yes Yes Yes Yes Yes Yes  Type of Advance Directive Living will;Healthcare Power of [place];Living will [place];Living will Living will;Healthcare Power of Attorney  Does patient want to make changes to medical advance directive? No - Patient declined No - Patient declined - No - Patient declined No - Patient declined No - Patient declined  Copy of [place] in Chart? No - copy requested - No - copy requested No - copy requested No - copy requested No - copy requested    Tobacco Social History   Tobacco Use  Smoking Status Never Smoker  Smokeless Tobacco Never Used     Counseling given: Not Answered   Clinical Intake:                       Past Medical History:  Diagnosis Date  . Basal cell carcinoma of forehead   . Duodenal ulcer 12/2003   in [place]  . Dyslipidemia   . External hemorrhoid   . Eye injury    Right eye as a child.   [name] GERD (gastroesophageal reflux disease)   . Internal hemorrhoid   . Osteoarthritis   . Polymyositis ([place])   . Shingles 2005  . Vertigo    Past  Surgical History:  Procedure Laterality Date  . CATARACT EXTRACTION, BILATERAL  2000  . COLONOSCOPY    . ESOPHAGOGASTRODUODENOSCOPY    . TONSILLECTOMY     Family History  Problem Relation Age of Onset  . Hyperlipidemia Mother   . Hypertension Mother   . Stroke Mother 29  . Alcohol abuse Father   . Hyperlipidemia Sister    Social History   Socioeconomic History  . Marital status: Single    Spouse name: Not on file  . Number of children: Not on file  . Years of education: PHD  . Highest education level: Master's degree (e.g., MA, MS, MEng, MEd, MSW, MBA)  Occupational History  . Occupation: Personal assistant.     Employer: [employer]  Tobacco Use  . Smoking status: Never Smoker  . Smokeless tobacco: Never Used  Substance and Sexual Activity  . Alcohol use: Yes    Alcohol/week: 2.0 standard drinks    Types: 2 Glasses of wine per week  . Drug use: No  . Sexual activity: Not Currently  Other Topics Concern  . Not on file  Social History Narrative   [employer], Ph.D.  Mother lives in a retirement home here in [place].  Travels around the world.  Single and never had a sexual partner. Quit smoking 1962, no drugs, no EtOH, 1 caffeinated drink per day, Regular exercise, including walking and golf.  Water exercising.        Social Determinants of Health   Financial Resource Strain: Low Risk   . Difficulty of Paying Living Expenses: Not hard at all  Food Insecurity: No Food Insecurity  . Worried About Charity fundraiser in the Last Year: Never true  . Ran Out of Food in the Last Year: Never true  Transportation Needs: No Transportation Needs  . Lack of Transportation (Medical): No  . Lack of Transportation (Non-Medical): No  Physical Activity: Sufficiently Active  . Days of Exercise per Week: 7 days  . Minutes of Exercise per Session: 120 min  Stress: No Stress Concern Present  . Feeling of Stress : Not at all  Social Connections: Slightly Isolated   . Frequency of Communication with Friends and Family: More than three times a week  . Frequency of Social Gatherings with Friends and Family: More than three times a week  . Attends Religious Services: More than 4 times per year  . Active Member of Clubs or Organizations: Yes  . Attends Archivist Meetings: More than 4 times per year  . Marital Status: Never married    Outpatient Encounter Medications as of 02/29/2020  Medication Sig  . AMBULATORY NON FORMULARY MEDICATION Typhoid vaccine (Patient not taking: Reported on 02/25/2019)  . AMBULATORY NON FORMULARY MEDICATION Medication Name: Tdap x 1 IM  . simvastatin (ZOCOR) 40 MG tablet Take 1 tablet (40 mg total) by mouth daily at 6 PM.   No facility-administered encounter medications on file as of 02/29/2020.    Activities of Daily Living In your present state of health, do you have any difficulty performing the following activities: 02/25/2019  Hearing? Y  Comment wears hearing aides all the time  Vision? N  Difficulty concentrating or making decisions? N  Walking or climbing stairs? N  Dressing or bathing? N  Doing errands, shopping? N  Preparing Food and eating ? N  Using the Toilet? N  In the past six months, have you accidently leaked urine? N  Do you have problems with loss of bowel control? N  Managing your Medications? N  Managing your Finances? N  Housekeeping or managing your Housekeeping? N  Some recent data might be hidden    Patient Care Team: Hali Marry, MD as PCP - General Juliann Mule, MD as Referring Physician (Ophthalmology)    Assessment:   This is a routine wellness examination for [facility].Physical assessment deferred to PCP.   Exercise Activities and Dietary recommendations   Diet  Breakfast: Lunch:  Dinner:       Goals    . Weight (lb) < 200 lb (90.7 kg)     Patient would like to loose 10 lbs.       Fall Risk Fall Risk  02/25/2019 05/14/2016 05/10/2015 04/01/2014 03/19/2013   Falls in the past year? 0 No No No Yes  Number falls in past yr: - - - - 1  Injury with Fall? - - - - No  Follow up Falls prevention discussed - - - -   Is the patient's home free of loose throw rugs in walkways, pet beds, electrical cords, etc?   {Blank single:19197::"yes","no"}      Grab bars in the bathroom? {Blank single:19197::"yes","no"}      Handrails on the stairs?   {  Blank single:19197::"yes","no"}      Adequate lighting?   {Blank single:19197::"yes","no"}   Depression Screen PHQ 2/9 Scores 02/25/2019 04/22/2017 05/14/2016 05/10/2015  PHQ - 2 Score 0 0 0 0     Cognitive Function     6CIT Screen 02/25/2019 04/22/2017  What Year? 0 points 0 points  What month? 0 points 0 points  What time? 0 points 0 points  Count back from 20 0 points 0 points  Months in reverse 0 points 0 points  Repeat phrase 0 points 2 points  Total Score 0 2    Immunization History  Administered Date(s) Administered  . Influenza Split 10/24/2012  . Influenza Whole 10/24/2005, 12/30/2006, 10/24/2009  . Influenza, High Dose Seasonal PF 09/12/2018  . Influenza,inj,Quad PF,6+ Mos 09/23/2014, 08/24/2017  . Influenza-Unspecified 09/23/2013  . Pneumococcal Conjugate-13 02/04/2015  . Pneumococcal Polysaccharide-23 12/30/2006  . Td 12/30/2006  . Typhoid Inactivated 06/23/2018  . Zoster 10/24/2012    Screening Tests Health Maintenance  Topic Date Due  . TETANUS/TDAP  12/24/2028  . INFLUENZA VACCINE  Completed  . DEXA SCAN  Completed  . PNA vac Low Risk Adult  Completed      Plan:   ***   I have personally reviewed and noted the following in the patient's chart:   . Medical and social history . Use of alcohol, tobacco or illicit drugs  . Current medications and supplements . Functional ability and status . Nutritional status . Physical activity . Advanced directives . List of other physicians . Hospitalizations, surgeries, and ER visits in previous 12 months . Vitals . Screenings to  include cognitive, depression, and falls . Referrals and appointments  In addition, I have reviewed and discussed with patient certain preventive protocols, quality metrics, and best practice recommendations. A written personalized care plan for preventive services as well as general preventive health recommendations were provided to patient.     Joanne Chars, LPN  [id number]

## 2020-02-29 ENCOUNTER — Ambulatory Visit: Payer: Medicare HMO

## 2020-08-16 DIAGNOSIS — L821 Other seborrheic keratosis: Secondary | ICD-10-CM | POA: Diagnosis not present

## 2020-08-16 DIAGNOSIS — Z85828 Personal history of other malignant neoplasm of skin: Secondary | ICD-10-CM | POA: Diagnosis not present

## 2020-09-06 DIAGNOSIS — Z20822 Contact with and (suspected) exposure to covid-19: Secondary | ICD-10-CM | POA: Diagnosis not present
